# Patient Record
Sex: Female | Born: 1970 | Race: Black or African American | Hispanic: No | Marital: Single | Smoking: Never smoker
Health system: Southern US, Community
[De-identification: ages and names within clinical notes are randomized; demographics above are authoritative.]

## PROBLEM LIST (undated history)

## (undated) DIAGNOSIS — E785 Hyperlipidemia, unspecified: Secondary | ICD-10-CM

## (undated) HISTORY — DX: Hyperlipidemia, unspecified: E78.5

---

## 2014-09-18 ENCOUNTER — Other Ambulatory Visit: Payer: Self-pay

## 2014-09-18 DIAGNOSIS — Z1231 Encounter for screening mammogram for malignant neoplasm of breast: Secondary | ICD-10-CM

## 2014-10-26 ENCOUNTER — Ambulatory Visit
Admission: RE | Admit: 2014-10-26 | Discharge: 2014-10-26 | Disposition: A | Discharge status: Home / Self Care | Payer: Federal, State, Local not specified - PPO | Source: Ambulatory Visit

## 2014-10-26 DIAGNOSIS — Z1231 Encounter for screening mammogram for malignant neoplasm of breast: Secondary | ICD-10-CM

## 2015-06-18 ENCOUNTER — Other Ambulatory Visit: Payer: Self-pay | Admitting: Obstetrics and Gynecology

## 2015-06-18 DIAGNOSIS — N63 Unspecified lump in unspecified breast: Secondary | ICD-10-CM

## 2015-06-24 ENCOUNTER — Other Ambulatory Visit: Payer: Self-pay | Admitting: Obstetrics and Gynecology

## 2015-06-24 ENCOUNTER — Ambulatory Visit
Admission: RE | Admit: 2015-06-24 | Discharge: 2015-06-24 | Disposition: A | Discharge status: Home / Self Care | Payer: Federal, State, Local not specified - PPO | Source: Ambulatory Visit | Attending: Obstetrics and Gynecology | Admitting: Obstetrics and Gynecology

## 2015-06-24 DIAGNOSIS — N63 Unspecified lump in unspecified breast: Secondary | ICD-10-CM

## 2015-10-08 ENCOUNTER — Other Ambulatory Visit: Payer: Self-pay | Admitting: Obstetrics and Gynecology

## 2015-10-08 DIAGNOSIS — Z1231 Encounter for screening mammogram for malignant neoplasm of breast: Secondary | ICD-10-CM

## 2015-10-19 DIAGNOSIS — G43719 Chronic migraine without aura, intractable, without status migrainosus: Secondary | ICD-10-CM | POA: Diagnosis not present

## 2015-10-19 DIAGNOSIS — G43839 Menstrual migraine, intractable, without status migrainosus: Secondary | ICD-10-CM | POA: Diagnosis not present

## 2015-10-29 ENCOUNTER — Ambulatory Visit
Admission: RE | Admit: 2015-10-29 | Discharge: 2015-10-29 | Disposition: A | Discharge status: Home / Self Care | Payer: Federal, State, Local not specified - PPO | Source: Ambulatory Visit | Attending: Obstetrics and Gynecology | Admitting: Obstetrics and Gynecology

## 2015-10-29 DIAGNOSIS — Z1231 Encounter for screening mammogram for malignant neoplasm of breast: Secondary | ICD-10-CM

## 2015-10-29 IMAGING — MG 2D DIGITAL SCREENING BILATERAL MAMMOGRAM WITH CAD AND ADJUNCT TO
9 of 12 series · 9 of 28 positions shown · non-contrast
Comparison: Previous exam(s).

CLINICAL DATA: Screening.

EXAM:
2D DIGITAL SCREENING BILATERAL MAMMOGRAM WITH CAD AND ADJUNCT TOMO

[R CC]
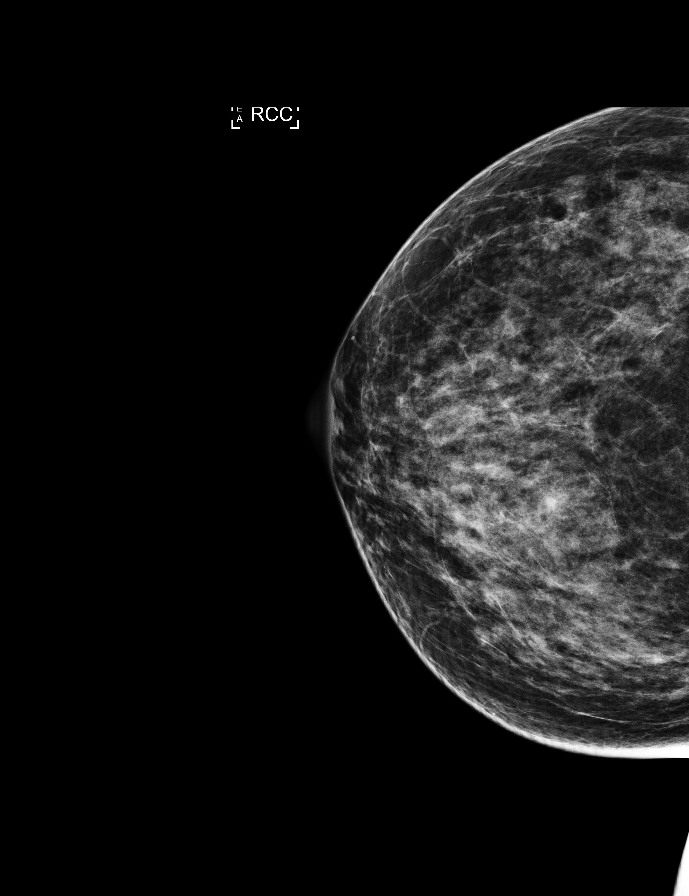

[L CC]
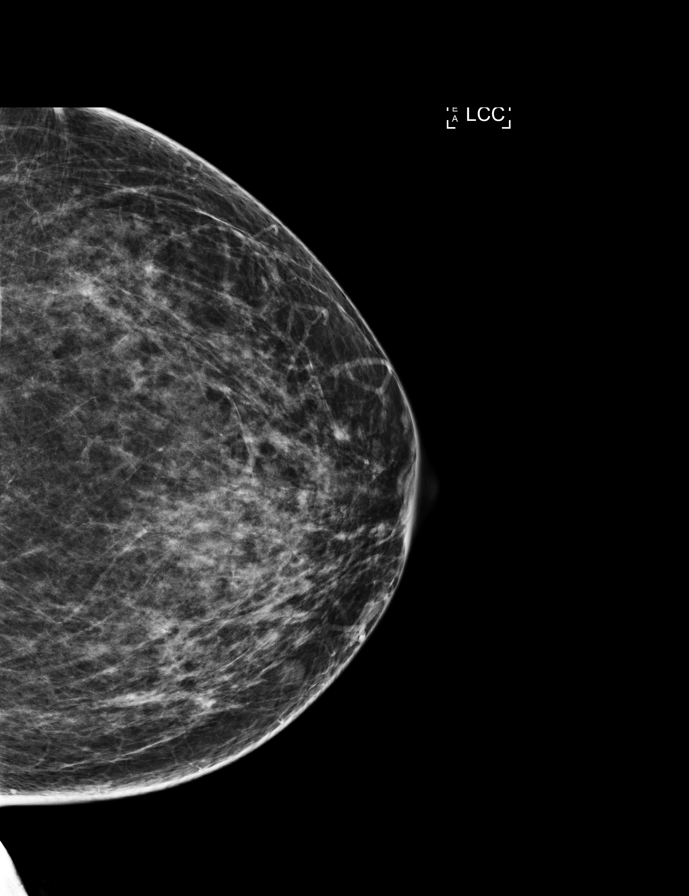

[L CC synth-2D]
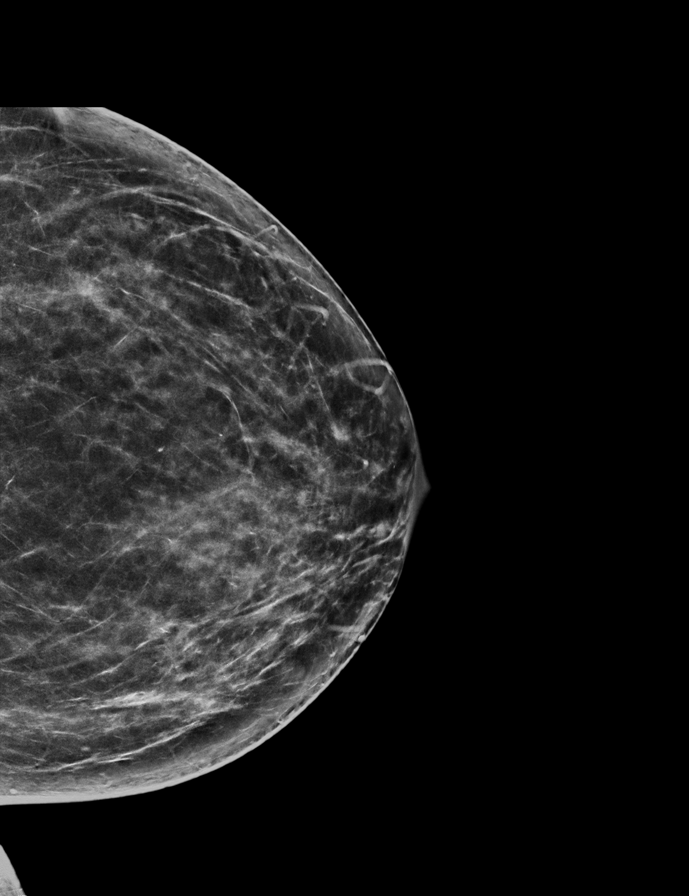

[R MLO synth-2D]
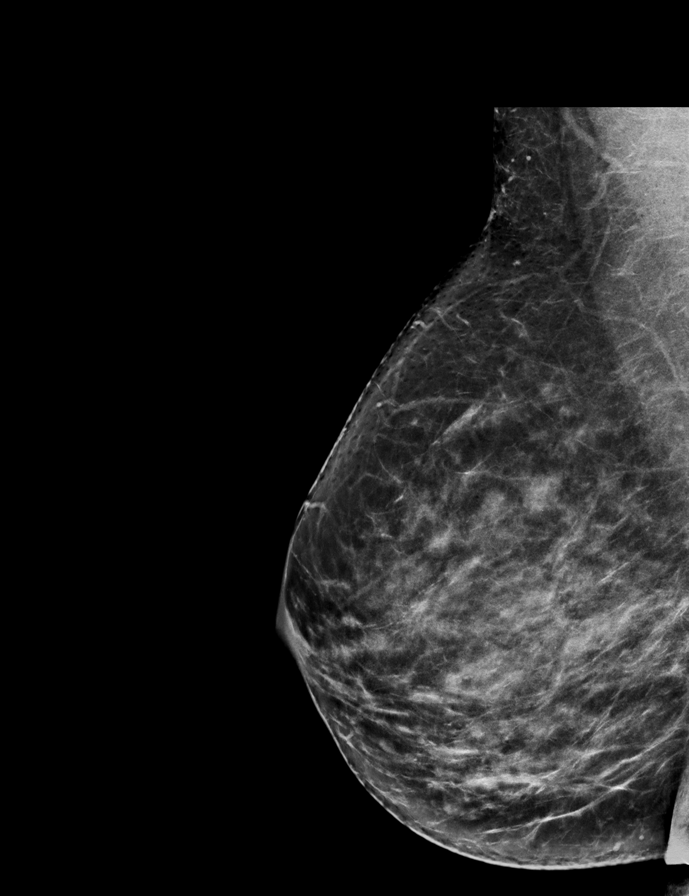

[R CC synth-2D]
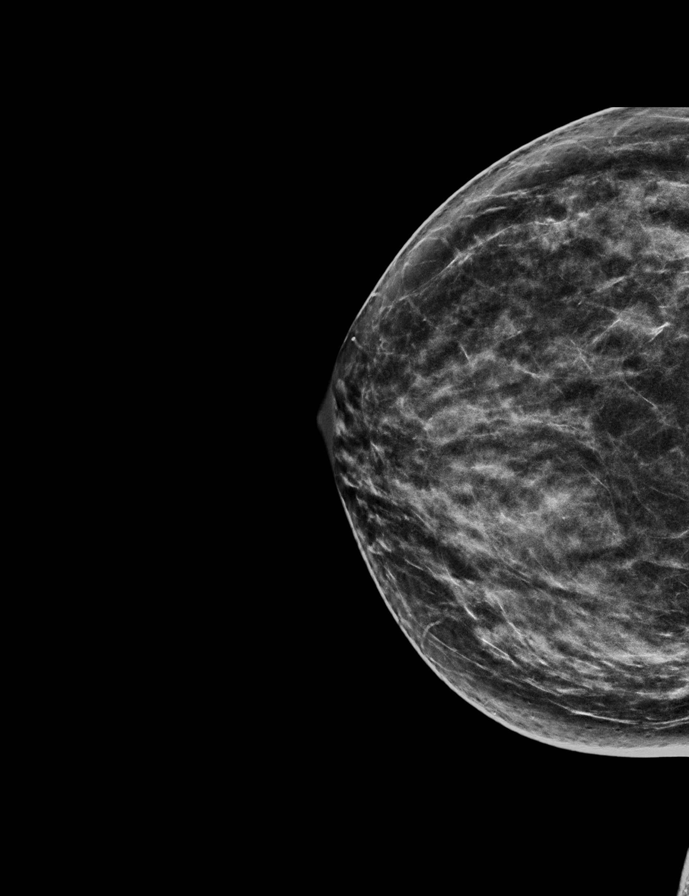

[L MLO synth-2D]
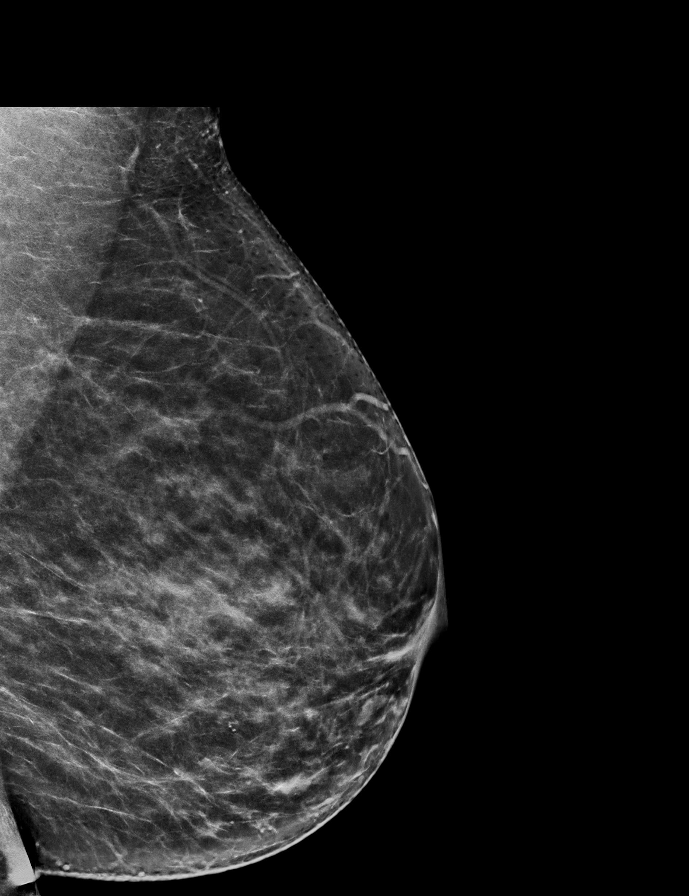

[R MLO]
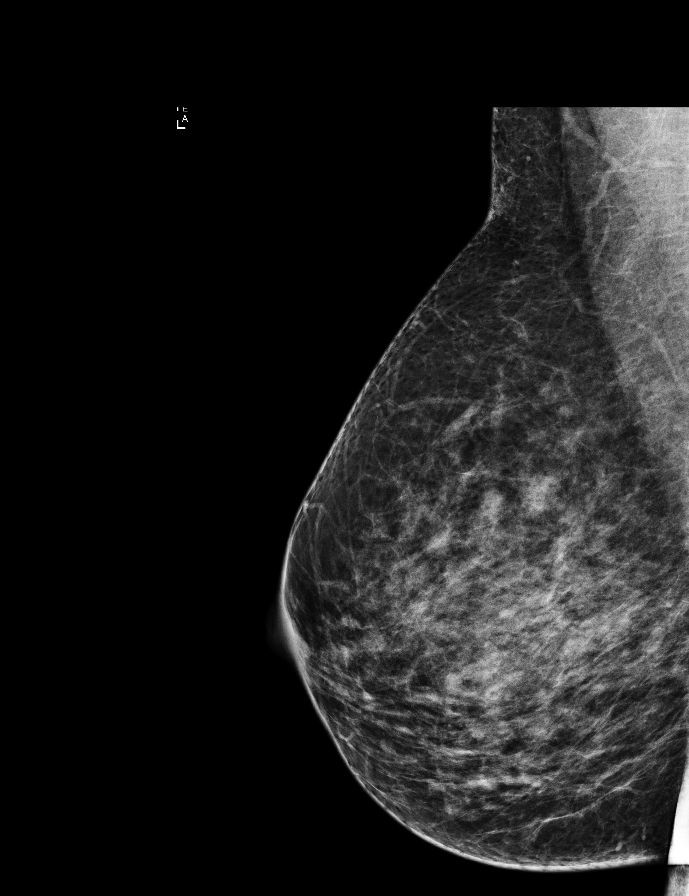

[L MLO]
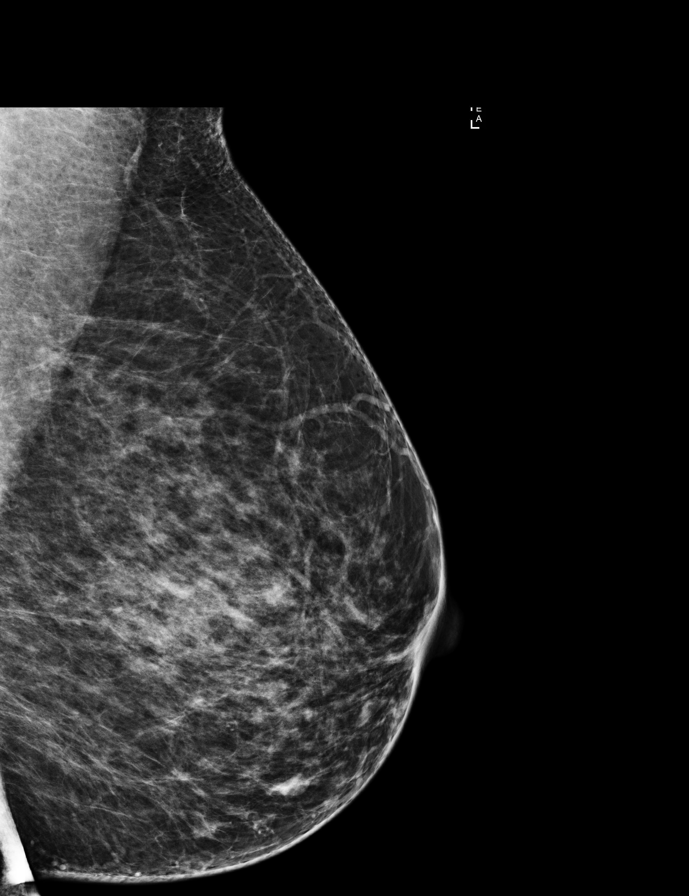

[R CC tomo · tomo slice 33/64.0]
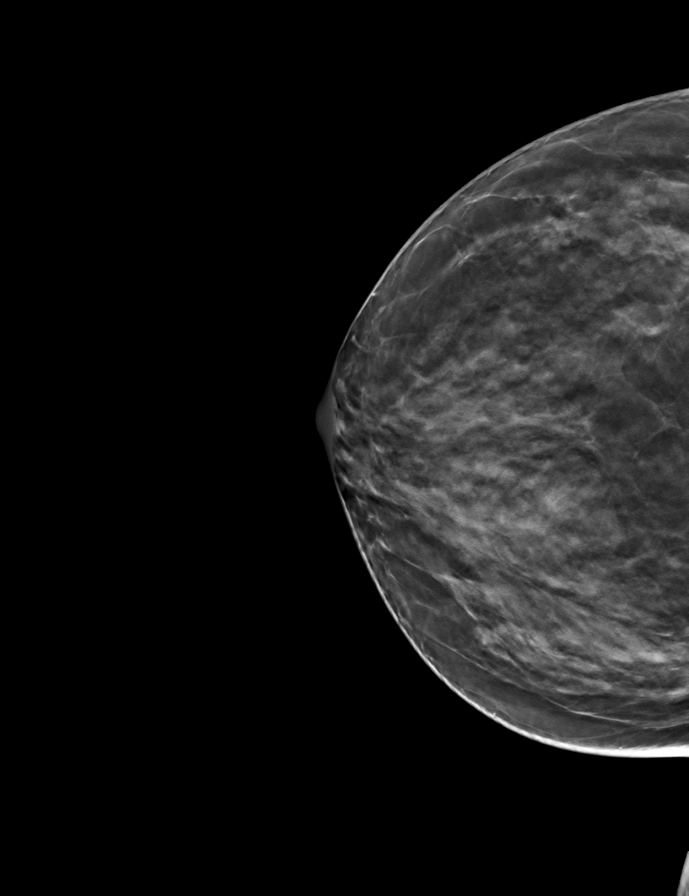

[9 of 28 positions shown; findings below may reference images not displayed]

ACR Breast Density Category c: The breast tissue is heterogeneously
dense, which may obscure small masses.
FINDINGS: In the right breast, an asymmetry warrants further evaluation. In
the left breast, no findings suspicious for malignancy. Images were
processed with CAD.
IMPRESSION: Further evaluation is suggested for an asymmetry in the right
breast.

RECOMMENDATION:
Diagnostic mammogram and possibly ultrasound of the right breast.
(Code:[6W])

The patient will be contacted regarding the findings, and additional
imaging will be scheduled.

BI-RADS CATEGORY  0: Incomplete. Need additional imaging evaluation
and/or prior mammograms for comparison.

## 2015-11-04 ENCOUNTER — Other Ambulatory Visit: Payer: Self-pay | Admitting: Obstetrics and Gynecology

## 2015-11-04 DIAGNOSIS — N6489 Other specified disorders of breast: Secondary | ICD-10-CM | POA: Diagnosis not present

## 2015-11-04 DIAGNOSIS — R928 Other abnormal and inconclusive findings on diagnostic imaging of breast: Secondary | ICD-10-CM

## 2015-11-10 ENCOUNTER — Ambulatory Visit
Admission: RE | Admit: 2015-11-10 | Discharge: 2015-11-10 | Disposition: A | Discharge status: Home / Self Care | Payer: Federal, State, Local not specified - PPO | Source: Ambulatory Visit | Attending: Obstetrics and Gynecology | Admitting: Obstetrics and Gynecology

## 2015-11-10 DIAGNOSIS — R928 Other abnormal and inconclusive findings on diagnostic imaging of breast: Secondary | ICD-10-CM

## 2015-11-10 DIAGNOSIS — R922 Inconclusive mammogram: Secondary | ICD-10-CM | POA: Diagnosis not present

## 2015-12-03 DIAGNOSIS — Z800 Family history of malignant neoplasm of digestive organs: Secondary | ICD-10-CM | POA: Diagnosis not present

## 2015-12-03 DIAGNOSIS — Z0000 Encounter for general adult medical examination without abnormal findings: Secondary | ICD-10-CM | POA: Diagnosis not present

## 2015-12-06 DIAGNOSIS — K080 Exfoliation of teeth due to systemic causes: Secondary | ICD-10-CM | POA: Diagnosis not present

## 2015-12-27 DIAGNOSIS — Z86010 Personal history of colonic polyps: Secondary | ICD-10-CM | POA: Diagnosis not present

## 2016-04-04 DIAGNOSIS — G43719 Chronic migraine without aura, intractable, without status migrainosus: Secondary | ICD-10-CM | POA: Diagnosis not present

## 2016-04-04 DIAGNOSIS — G43839 Menstrual migraine, intractable, without status migrainosus: Secondary | ICD-10-CM | POA: Diagnosis not present

## 2016-07-25 DIAGNOSIS — K080 Exfoliation of teeth due to systemic causes: Secondary | ICD-10-CM | POA: Diagnosis not present

## 2016-09-19 DIAGNOSIS — K080 Exfoliation of teeth due to systemic causes: Secondary | ICD-10-CM | POA: Diagnosis not present

## 2016-11-15 DIAGNOSIS — G43719 Chronic migraine without aura, intractable, without status migrainosus: Secondary | ICD-10-CM | POA: Diagnosis not present

## 2016-11-15 DIAGNOSIS — G43839 Menstrual migraine, intractable, without status migrainosus: Secondary | ICD-10-CM | POA: Diagnosis not present

## 2016-12-05 DIAGNOSIS — Z131 Encounter for screening for diabetes mellitus: Secondary | ICD-10-CM | POA: Diagnosis not present

## 2016-12-05 DIAGNOSIS — Z0000 Encounter for general adult medical examination without abnormal findings: Secondary | ICD-10-CM | POA: Diagnosis not present

## 2016-12-05 DIAGNOSIS — E7800 Pure hypercholesterolemia, unspecified: Secondary | ICD-10-CM | POA: Diagnosis not present

## 2016-12-26 ENCOUNTER — Other Ambulatory Visit: Payer: Self-pay | Admitting: Physician Assistant

## 2016-12-26 DIAGNOSIS — Z1231 Encounter for screening mammogram for malignant neoplasm of breast: Secondary | ICD-10-CM

## 2017-01-08 ENCOUNTER — Ambulatory Visit: Payer: Federal, State, Local not specified - PPO

## 2017-01-15 ENCOUNTER — Ambulatory Visit
Admission: RE | Admit: 2017-01-15 | Discharge: 2017-01-15 | Disposition: A | Discharge status: Home / Self Care | Payer: Federal, State, Local not specified - PPO | Source: Ambulatory Visit | Attending: Physician Assistant | Admitting: Physician Assistant

## 2017-01-15 DIAGNOSIS — Z1231 Encounter for screening mammogram for malignant neoplasm of breast: Secondary | ICD-10-CM | POA: Diagnosis not present

## 2017-05-07 DIAGNOSIS — G43719 Chronic migraine without aura, intractable, without status migrainosus: Secondary | ICD-10-CM | POA: Diagnosis not present

## 2017-05-07 DIAGNOSIS — G43839 Menstrual migraine, intractable, without status migrainosus: Secondary | ICD-10-CM | POA: Diagnosis not present

## 2017-08-09 DIAGNOSIS — K080 Exfoliation of teeth due to systemic causes: Secondary | ICD-10-CM | POA: Diagnosis not present

## 2017-08-10 DIAGNOSIS — Z01419 Encounter for gynecological examination (general) (routine) without abnormal findings: Secondary | ICD-10-CM | POA: Diagnosis not present

## 2017-08-10 DIAGNOSIS — Z1151 Encounter for screening for human papillomavirus (HPV): Secondary | ICD-10-CM | POA: Diagnosis not present

## 2017-08-10 DIAGNOSIS — K5900 Constipation, unspecified: Secondary | ICD-10-CM | POA: Diagnosis not present

## 2017-10-10 DIAGNOSIS — K625 Hemorrhage of anus and rectum: Secondary | ICD-10-CM | POA: Diagnosis not present

## 2017-10-10 DIAGNOSIS — K5909 Other constipation: Secondary | ICD-10-CM | POA: Diagnosis not present

## 2017-10-10 DIAGNOSIS — R634 Abnormal weight loss: Secondary | ICD-10-CM | POA: Diagnosis not present

## 2017-10-31 DIAGNOSIS — K648 Other hemorrhoids: Secondary | ICD-10-CM | POA: Diagnosis not present

## 2017-10-31 DIAGNOSIS — K625 Hemorrhage of anus and rectum: Secondary | ICD-10-CM | POA: Diagnosis not present

## 2017-10-31 DIAGNOSIS — Z800 Family history of malignant neoplasm of digestive organs: Secondary | ICD-10-CM | POA: Diagnosis not present

## 2017-11-07 DIAGNOSIS — G43839 Menstrual migraine, intractable, without status migrainosus: Secondary | ICD-10-CM | POA: Diagnosis not present

## 2017-11-07 DIAGNOSIS — M791 Myalgia, unspecified site: Secondary | ICD-10-CM | POA: Diagnosis not present

## 2017-11-07 DIAGNOSIS — G43719 Chronic migraine without aura, intractable, without status migrainosus: Secondary | ICD-10-CM | POA: Diagnosis not present

## 2017-11-07 DIAGNOSIS — M542 Cervicalgia: Secondary | ICD-10-CM | POA: Diagnosis not present

## 2017-12-27 DIAGNOSIS — E7800 Pure hypercholesterolemia, unspecified: Secondary | ICD-10-CM | POA: Diagnosis not present

## 2017-12-27 DIAGNOSIS — Z13220 Encounter for screening for lipoid disorders: Secondary | ICD-10-CM | POA: Diagnosis not present

## 2017-12-27 DIAGNOSIS — Z0000 Encounter for general adult medical examination without abnormal findings: Secondary | ICD-10-CM | POA: Diagnosis not present

## 2017-12-27 DIAGNOSIS — Z23 Encounter for immunization: Secondary | ICD-10-CM | POA: Diagnosis not present

## 2018-01-09 ENCOUNTER — Other Ambulatory Visit: Payer: Self-pay | Admitting: Nurse Practitioner

## 2018-01-09 DIAGNOSIS — Z1231 Encounter for screening mammogram for malignant neoplasm of breast: Secondary | ICD-10-CM

## 2018-02-05 ENCOUNTER — Ambulatory Visit: Payer: Federal, State, Local not specified - PPO

## 2018-02-05 ENCOUNTER — Ambulatory Visit
Admission: RE | Admit: 2018-02-05 | Discharge: 2018-02-05 | Disposition: A | Discharge status: Home / Self Care | Payer: Federal, State, Local not specified - PPO | Source: Ambulatory Visit | Attending: Nurse Practitioner | Admitting: Nurse Practitioner

## 2018-02-05 DIAGNOSIS — Z1231 Encounter for screening mammogram for malignant neoplasm of breast: Secondary | ICD-10-CM | POA: Diagnosis not present

## 2018-02-05 IMAGING — MG DIGITAL SCREENING BILATERAL MAMMOGRAM WITH TOMO AND CAD
8 series · 9 of 24 positions shown · non-contrast
Comparison: Previous exam(s).

CLINICAL DATA: Screening.

EXAM:
DIGITAL SCREENING BILATERAL MAMMOGRAM WITH TOMO AND CAD

[R MLO synth-2D]
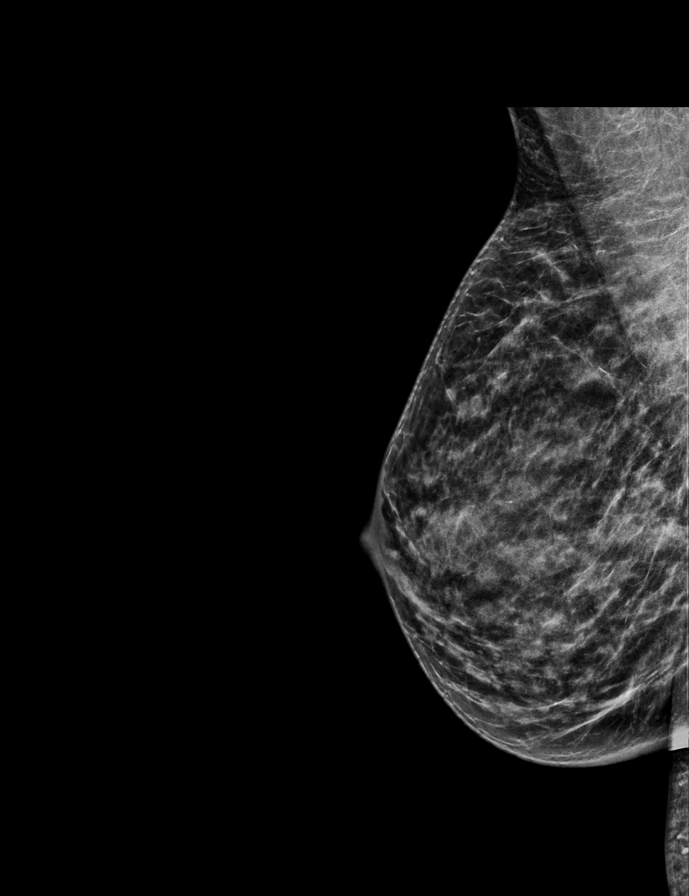

[L MLO synth-2D]
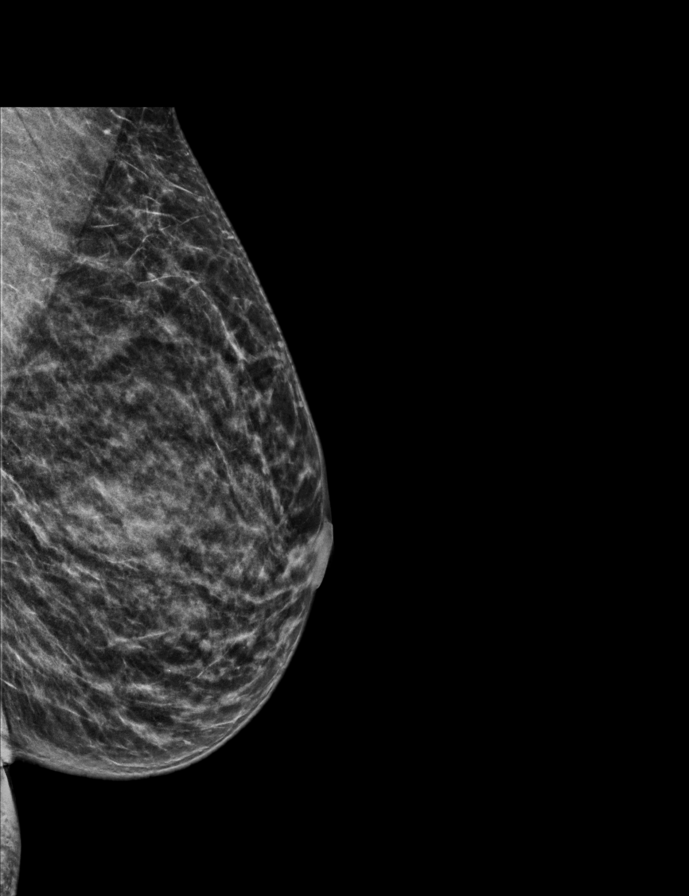

[L CC synth-2D]
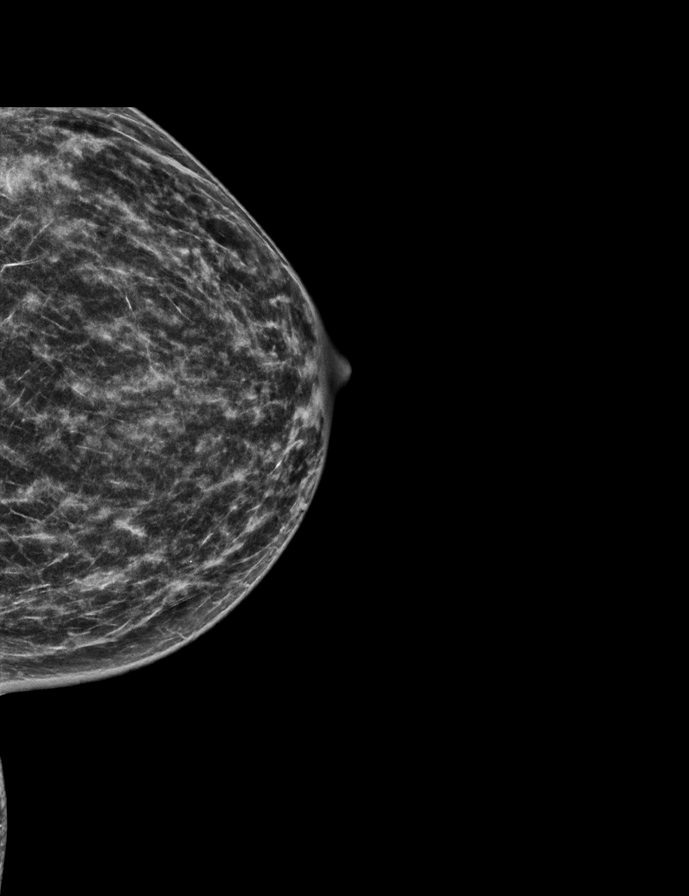

[R CC synth-2D]
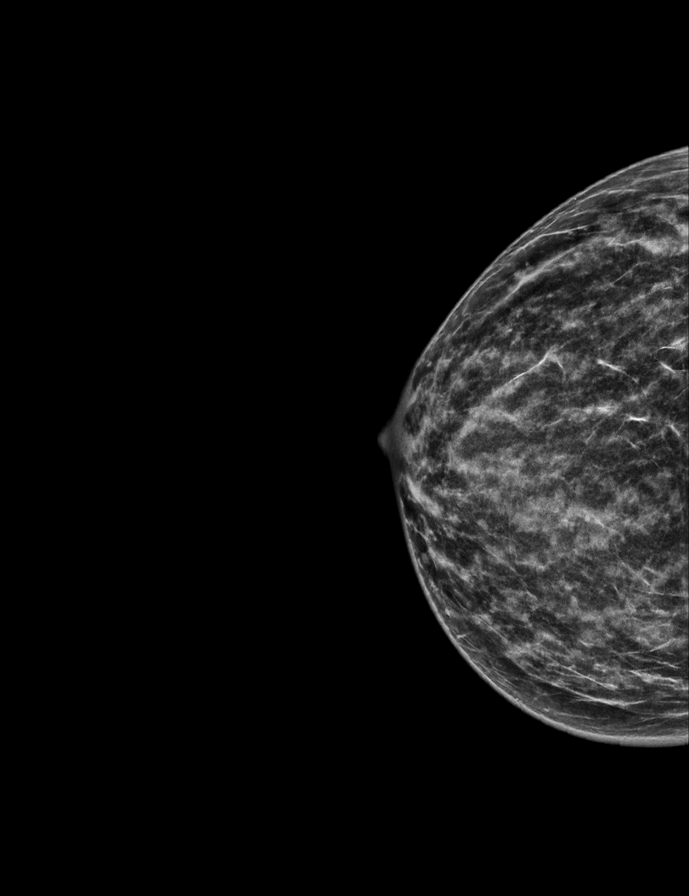

[L MLO tomo · 2 of 51 frames shown]
[frame 17/51]
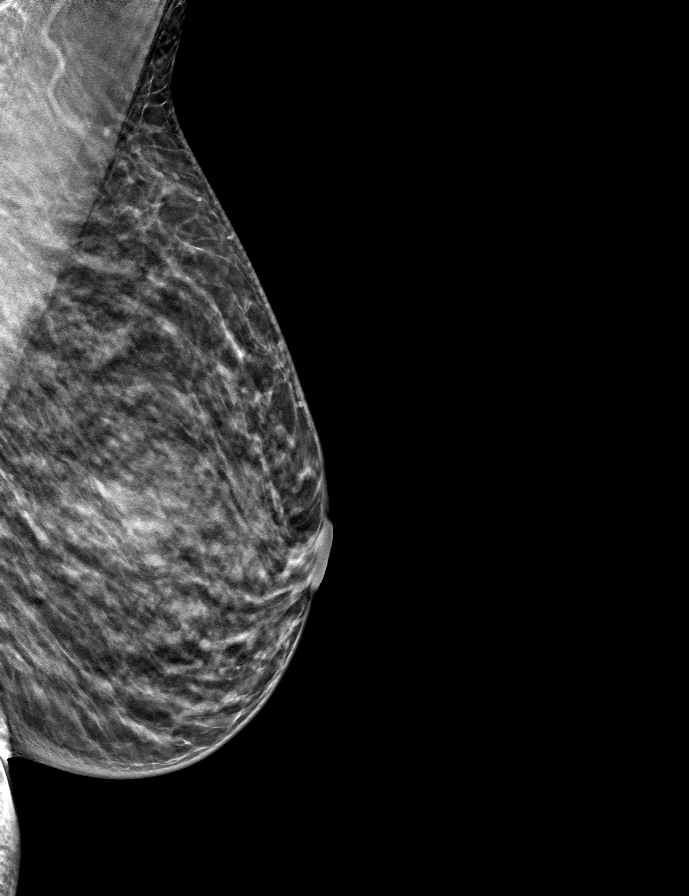
[frame 26/51]
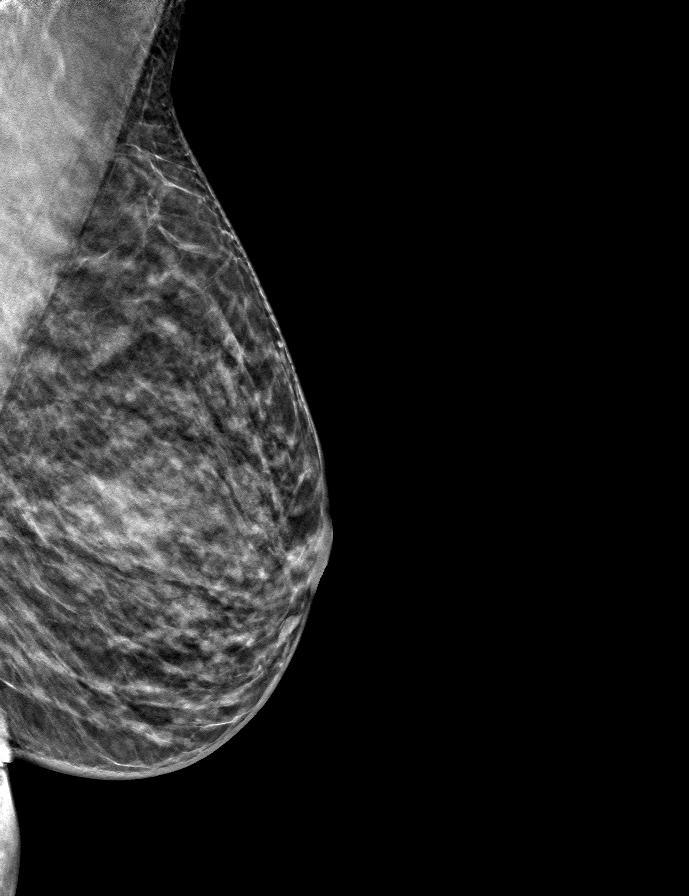

[R MLO tomo · tomo slice 27/54.0]
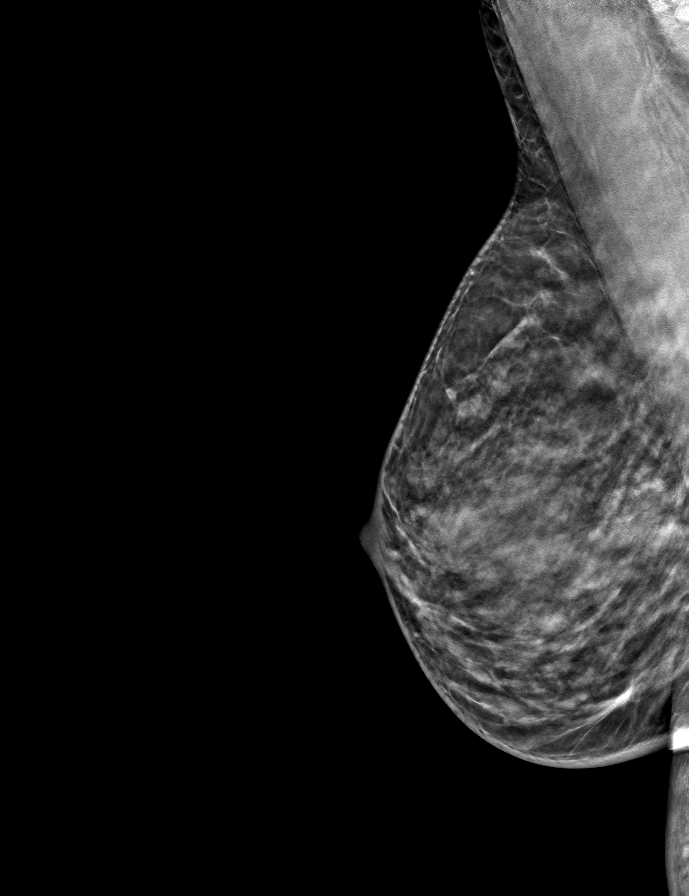

[L CC tomo · tomo slice 25/50.0]
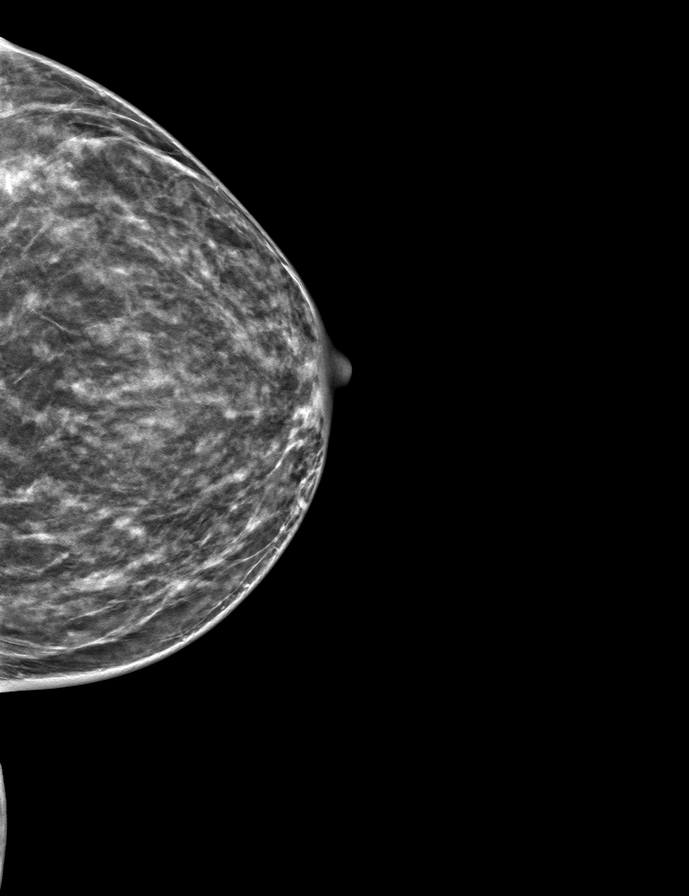

[R CC tomo · tomo slice 25/48.0]
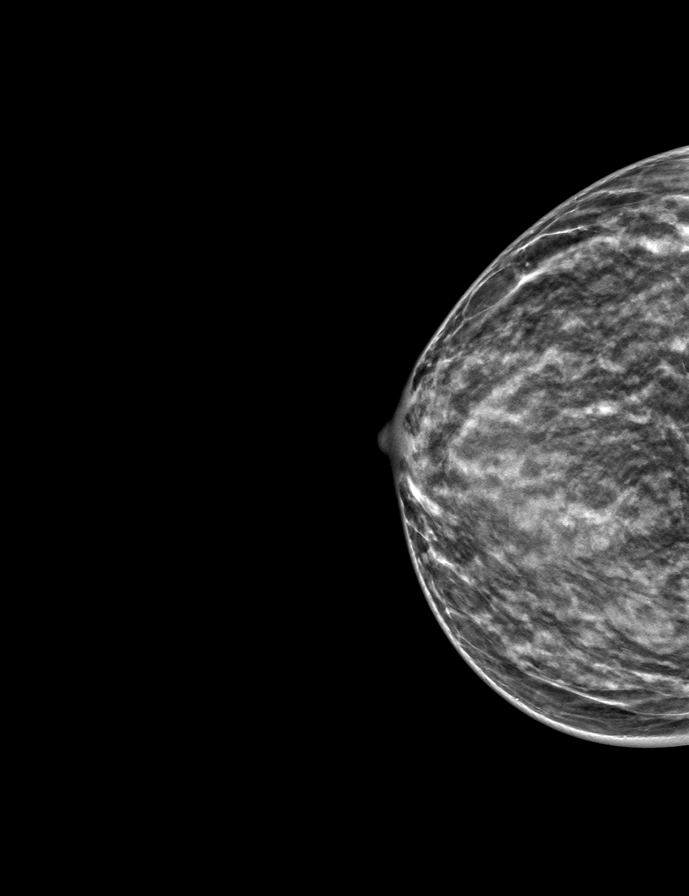

[9 of 24 positions shown; findings below may reference images not displayed]

ACR Breast Density Category c: The breast tissue is heterogeneously
dense, which may obscure small masses.
FINDINGS: There are no findings suspicious for malignancy. Images were
processed with CAD.
IMPRESSION: No mammographic evidence of malignancy. A result letter of this
screening mammogram will be mailed directly to the patient.

RECOMMENDATION:
Screening mammogram in one year. (Code:[5V])

BI-RADS CATEGORY  1: Negative.

## 2018-02-14 DIAGNOSIS — K080 Exfoliation of teeth due to systemic causes: Secondary | ICD-10-CM | POA: Diagnosis not present

## 2018-05-20 DIAGNOSIS — K5904 Chronic idiopathic constipation: Secondary | ICD-10-CM | POA: Diagnosis not present

## 2018-05-20 DIAGNOSIS — K625 Hemorrhage of anus and rectum: Secondary | ICD-10-CM | POA: Diagnosis not present

## 2018-05-20 DIAGNOSIS — K648 Other hemorrhoids: Secondary | ICD-10-CM | POA: Diagnosis not present

## 2018-05-20 DIAGNOSIS — R197 Diarrhea, unspecified: Secondary | ICD-10-CM | POA: Diagnosis not present

## 2018-06-19 DIAGNOSIS — K5904 Chronic idiopathic constipation: Secondary | ICD-10-CM | POA: Diagnosis not present

## 2018-06-19 DIAGNOSIS — K648 Other hemorrhoids: Secondary | ICD-10-CM | POA: Diagnosis not present

## 2018-06-19 DIAGNOSIS — K625 Hemorrhage of anus and rectum: Secondary | ICD-10-CM | POA: Diagnosis not present

## 2018-07-02 DIAGNOSIS — G43719 Chronic migraine without aura, intractable, without status migrainosus: Secondary | ICD-10-CM | POA: Diagnosis not present

## 2018-07-02 DIAGNOSIS — G43839 Menstrual migraine, intractable, without status migrainosus: Secondary | ICD-10-CM | POA: Diagnosis not present

## 2018-09-30 DIAGNOSIS — K5904 Chronic idiopathic constipation: Secondary | ICD-10-CM | POA: Diagnosis not present

## 2018-09-30 DIAGNOSIS — K648 Other hemorrhoids: Secondary | ICD-10-CM | POA: Diagnosis not present

## 2018-09-30 DIAGNOSIS — K625 Hemorrhage of anus and rectum: Secondary | ICD-10-CM | POA: Diagnosis not present

## 2019-05-06 DIAGNOSIS — Z0000 Encounter for general adult medical examination without abnormal findings: Secondary | ICD-10-CM | POA: Diagnosis not present

## 2019-05-30 DIAGNOSIS — E7800 Pure hypercholesterolemia, unspecified: Secondary | ICD-10-CM | POA: Diagnosis not present

## 2019-05-30 DIAGNOSIS — Z131 Encounter for screening for diabetes mellitus: Secondary | ICD-10-CM | POA: Diagnosis not present

## 2020-01-27 DIAGNOSIS — F419 Anxiety disorder, unspecified: Secondary | ICD-10-CM | POA: Diagnosis not present

## 2020-01-27 DIAGNOSIS — Z23 Encounter for immunization: Secondary | ICD-10-CM | POA: Diagnosis not present

## 2020-05-31 DIAGNOSIS — E7800 Pure hypercholesterolemia, unspecified: Secondary | ICD-10-CM | POA: Diagnosis not present

## 2020-05-31 DIAGNOSIS — Z0000 Encounter for general adult medical examination without abnormal findings: Secondary | ICD-10-CM | POA: Diagnosis not present

## 2020-05-31 DIAGNOSIS — Z23 Encounter for immunization: Secondary | ICD-10-CM | POA: Diagnosis not present

## 2020-05-31 DIAGNOSIS — Z5181 Encounter for therapeutic drug level monitoring: Secondary | ICD-10-CM | POA: Diagnosis not present

## 2020-06-01 ENCOUNTER — Other Ambulatory Visit: Payer: Self-pay | Admitting: Physician Assistant

## 2020-06-01 DIAGNOSIS — Z1231 Encounter for screening mammogram for malignant neoplasm of breast: Secondary | ICD-10-CM

## 2020-12-08 ENCOUNTER — Other Ambulatory Visit: Payer: Self-pay | Admitting: Physician Assistant

## 2020-12-08 DIAGNOSIS — R413 Other amnesia: Secondary | ICD-10-CM

## 2020-12-08 DIAGNOSIS — R519 Headache, unspecified: Secondary | ICD-10-CM | POA: Diagnosis not present

## 2020-12-19 ENCOUNTER — Ambulatory Visit
Admission: RE | Admit: 2020-12-19 | Discharge: 2020-12-19 | Disposition: A | Discharge status: Home / Self Care | Payer: Federal, State, Local not specified - PPO | Source: Ambulatory Visit | Attending: Physician Assistant | Admitting: Physician Assistant

## 2020-12-19 ENCOUNTER — Other Ambulatory Visit: Payer: Self-pay

## 2020-12-19 DIAGNOSIS — R413 Other amnesia: Secondary | ICD-10-CM

## 2020-12-19 DIAGNOSIS — I6782 Cerebral ischemia: Secondary | ICD-10-CM | POA: Diagnosis not present

## 2020-12-19 IMAGING — MR MR HEAD WO/W CM
13 series · 48 of 48 positions shown · IV contrast (multihance)
Comparison: None.

CLINICAL DATA: Memory loss

EXAM:
MRI HEAD WITHOUT AND WITH CONTRAST
TECHNIQUE: Multiplanar, multiecho pulse sequences of the brain and surrounding
structures were obtained without and with intravenous contrast.
CONTRAST:  13mL MULTIHANCE GADOBENATE DIMEGLUMINE 529 MG/ML IV SOLN

[Series 3: T1 · sagittal · 5.0mm · 0.45mm/px · 1 of 21 slices shown]
[im 1/21]
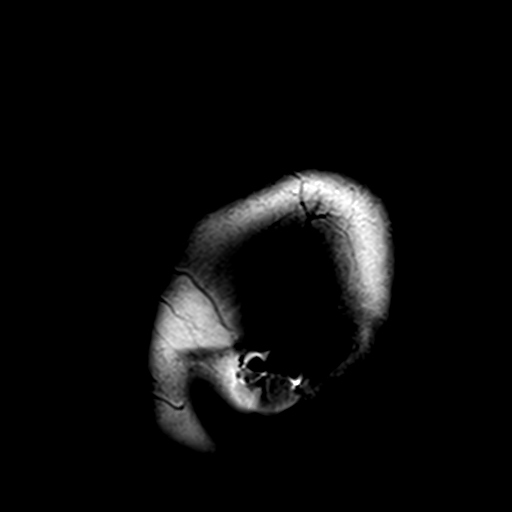

[Series 4: DWI · axial · 3.0mm · 1.80mm/px · z∈[-91,+55]mm · 6 of 100 slices shown (1 of 4)]
[im 1/100]
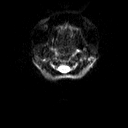
[im 20/100]
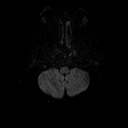
[im 40/100]
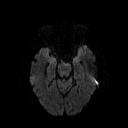
[im 60/100]
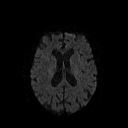
[im 80/100]
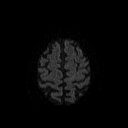
[im 100/100]
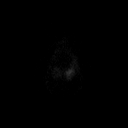

[Series 5: DWI · axial · 3.0mm · 1.80mm/px · z∈[-91,+55]mm · 3 of 50 slices shown (2 of 4)]
[im 1/50]
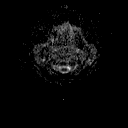
[im 25/50]
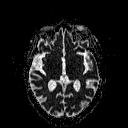
[im 50/50]
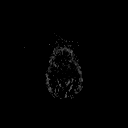

[Series 6: DWI · coronal · 5.0mm · 1.80mm/px · 5 of 68 slices shown (3 of 4)]
[im 1/68]
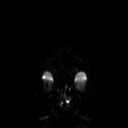
[im 17/68]
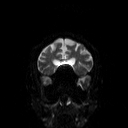
[im 34/68]
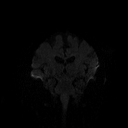
[im 51/68]
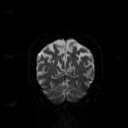
[im 68/68]
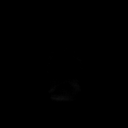

[Series 7: DWI · coronal · 5.0mm · 1.80mm/px · 2 of 34 slices shown (4 of 4)]
[im 1/34]
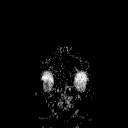
[im 34/34]
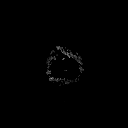

[Series 8: T2 · axial · 5.0mm · 0.60mm/px · 1 of 22 slices shown (1 of 2)]
[im 1/22]
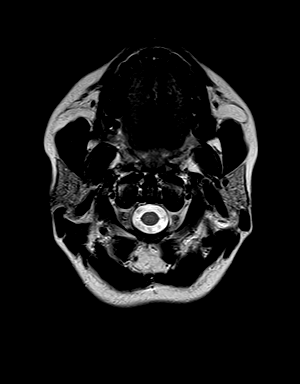

[Series 9: FLAIR · axial · 3.0mm · 0.45mm/px · z∈[-85,+49]mm · 2 of 30 slices shown (1 of 2)]
[im 1/30]
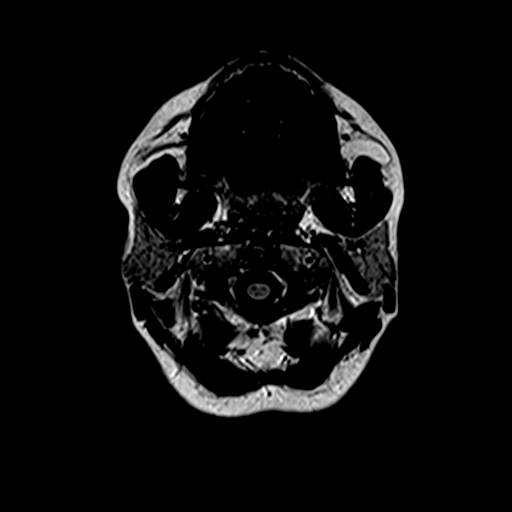
[im 30/30]
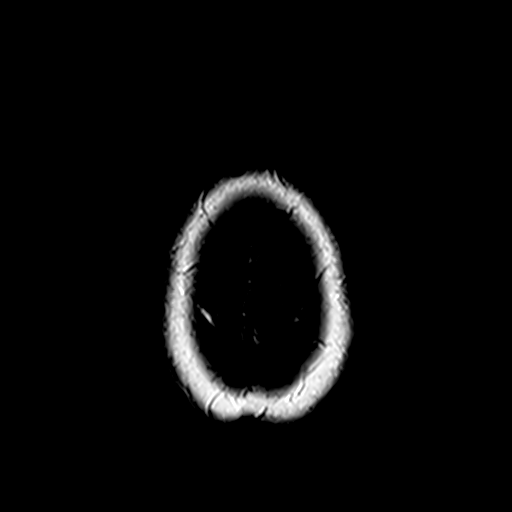

[Series 11: swi_images · axial · 4.0mm · 0.90mm/px · z∈[-87,+51]mm · 2 of 36 slices shown]
[im 1/36]
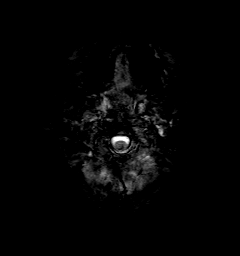
[im 36/36]
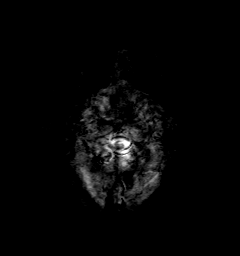

[Series 12: t1_mpr_tra · axial · 1.0mm · 0.75mm/px · z∈[-90,+51]mm · 10 of 144 slices shown]
[im 1/144]
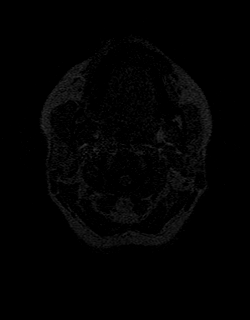
[im 16/144]
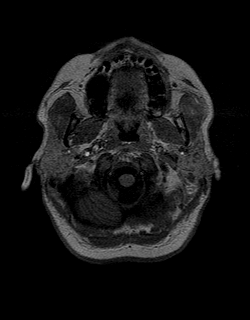
[im 32/144]
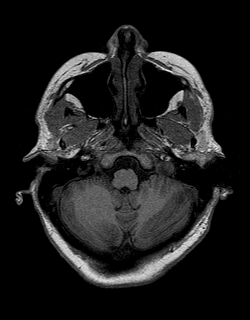
[im 48/144]
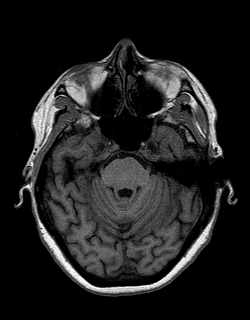
[im 64/144]
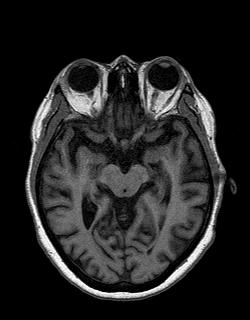
[im 80/144]
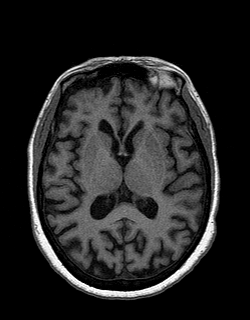
[im 96/144]
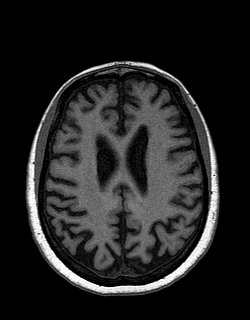
[im 112/144]
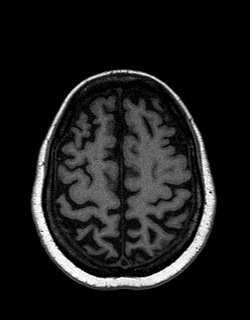
[im 128/144]
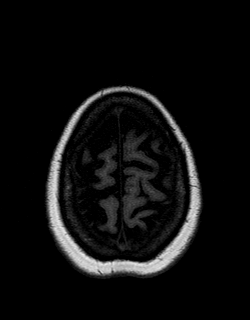
[im 144/144]
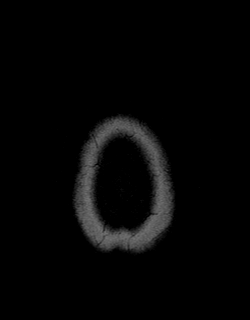

[Series 13: FLAIR · sagittal · 5.0mm · 0.45mm/px · 2 of 25 slices shown (2 of 2)]
[im 1/25]
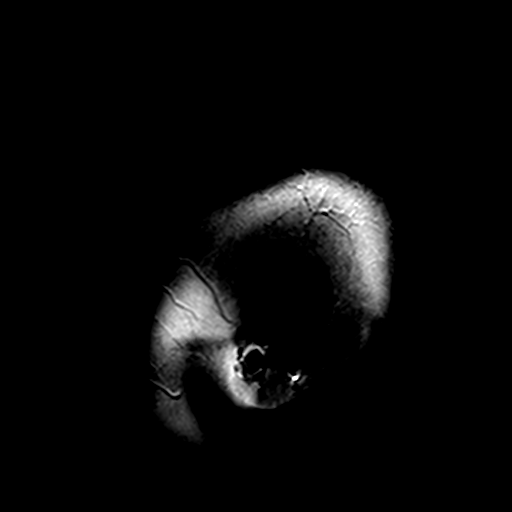
[im 25/25]
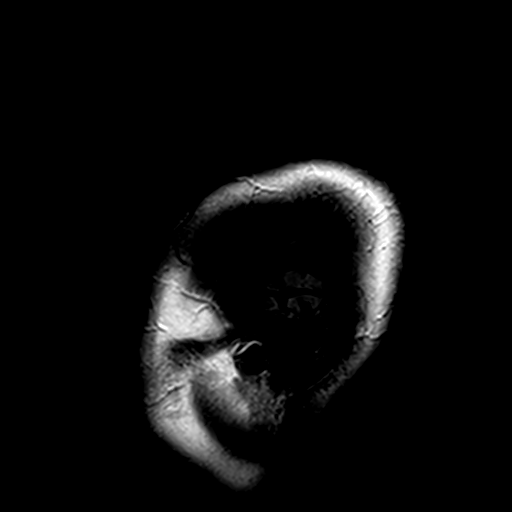

[Series 14: T2 · coronal · 5.0mm · 0.45mm/px · 2 of 25 slices shown (2 of 2)]
[im 1/25]
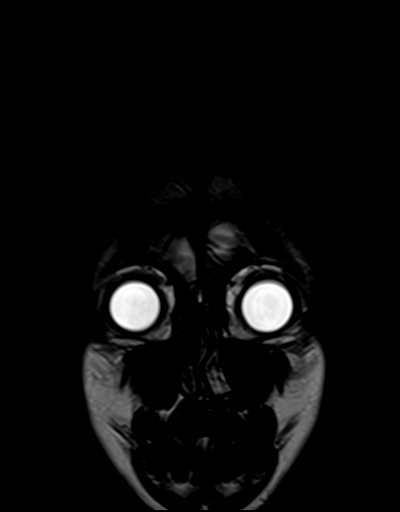
[im 25/25]
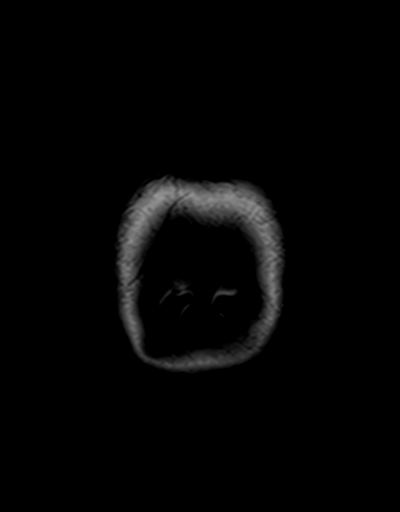

[Series 15: t1_mpr_tra post · axial · 1.0mm · 0.75mm/px · z∈[-90,+51]mm · 10 of 144 slices shown]
[im 1/144]
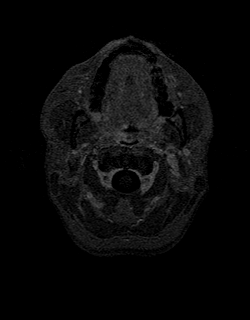
[im 16/144]
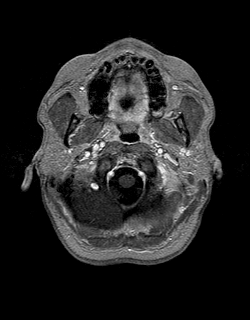
[im 32/144]
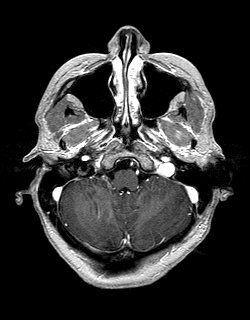
[im 48/144]
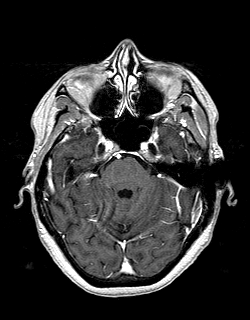
[im 64/144]
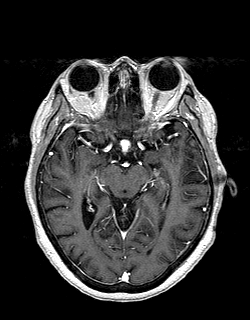
[im 80/144]
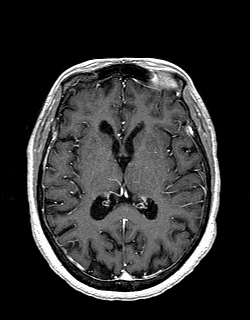
[im 96/144]
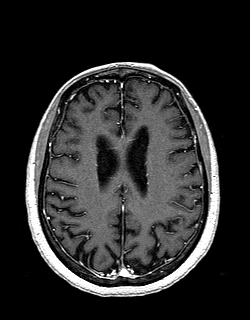
[im 112/144]
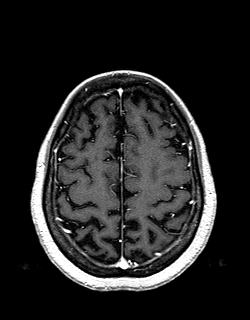
[im 128/144]
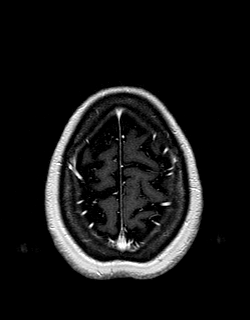
[im 144/144]
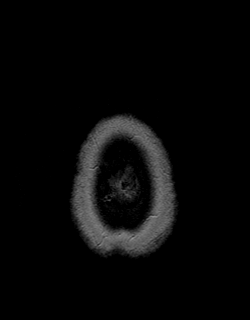

[Series 16: post cor · coronal · 5.0mm · 0.45mm/px · 2 of 25 slices shown]
[im 1/25]
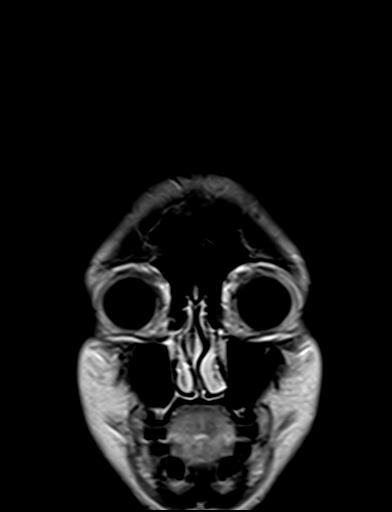
[im 25/25]
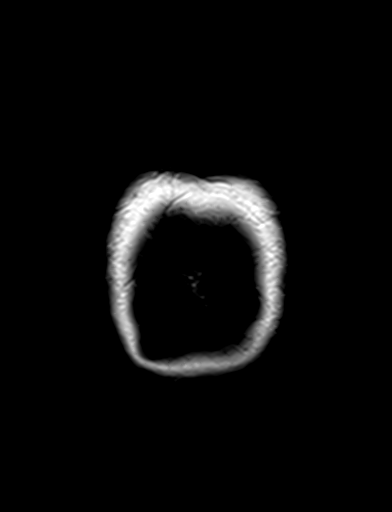

[48 of 48 positions shown; findings below may reference images not displayed]

FINDINGS: Brain: There is no acute infarction or intracranial hemorrhage.
There is no intracranial mass, mass effect, or edema. There is no
hydrocephalus or extra-axial fluid collection. Ventricles and sulci
are mildly prominent reflecting parenchymal volume loss. Patchy T2
hyperintensity in the supratentorial white matter is nonspecific but
may reflect minor chronic microvascular ischemic changes. No
abnormal enhancement.

Vascular: Major vessel flow voids at the skull base are preserved.

Skull and upper cervical spine: Normal marrow signal is preserved.

Sinuses/Orbits: Paranasal sinuses are aerated. Orbits are
unremarkable.

Other: There is superior convex margin of the pituitary with
suprasellar extension. Mastoid air cells are clear.
IMPRESSION: Parenchymal volume loss greater than expected for age.

Minor chronic microvascular ischemic changes.

Prominent pituitary for which dedicated MRI of the sella is
recommended.

## 2020-12-19 MED ORDER — GADOBENATE DIMEGLUMINE 529 MG/ML IV SOLN
13.0000 mL | Freq: Once | INTRAVENOUS | Status: AC | PRN
Start: 2020-12-19 — End: 2020-12-19
  Administered 2020-12-19: 13 mL via INTRAVENOUS

## 2020-12-21 ENCOUNTER — Encounter: Payer: Self-pay | Admitting: Physician Assistant

## 2020-12-21 ENCOUNTER — Ambulatory Visit: Payer: Federal, State, Local not specified - PPO

## 2020-12-25 ENCOUNTER — Other Ambulatory Visit: Payer: Self-pay

## 2020-12-25 ENCOUNTER — Ambulatory Visit
Admission: RE | Admit: 2020-12-25 | Discharge: 2020-12-25 | Disposition: A | Discharge status: Home / Self Care | Payer: Federal, State, Local not specified - PPO | Source: Ambulatory Visit | Attending: Physician Assistant | Admitting: Physician Assistant

## 2020-12-25 DIAGNOSIS — Z1231 Encounter for screening mammogram for malignant neoplasm of breast: Secondary | ICD-10-CM | POA: Diagnosis not present

## 2020-12-25 IMAGING — MG MM DIGITAL SCREENING BILAT W/ TOMO AND CAD
8 series · 8 of 24 positions shown · non-contrast
Comparison: Previous exam(s).

CLINICAL DATA: Screening.

EXAM:
DIGITAL SCREENING BILATERAL MAMMOGRAM WITH TOMOSYNTHESIS AND CAD
TECHNIQUE: Bilateral screening digital craniocaudal and mediolateral oblique
mammograms were obtained. Bilateral screening digital breast
tomosynthesis was performed. The images were evaluated with
computer-aided detection.

[R CC synth-2D]
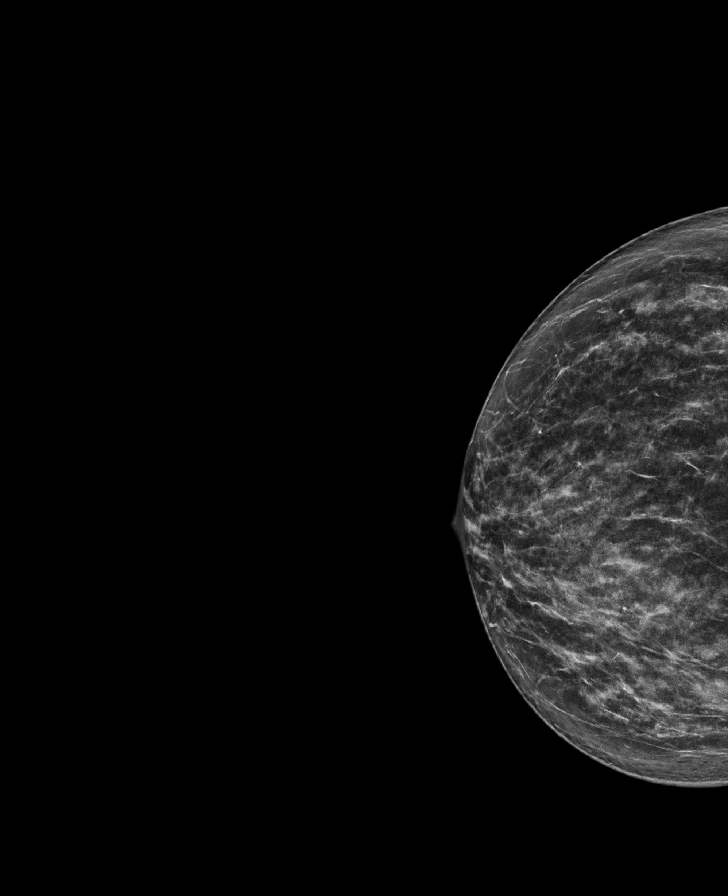

[L MLO synth-2D]
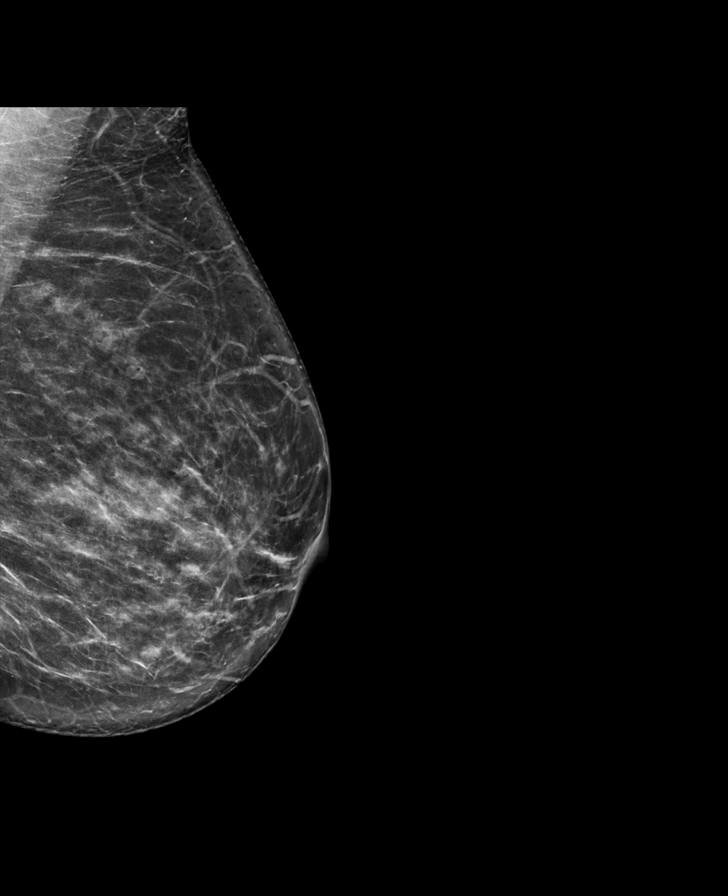

[L CC synth-2D]
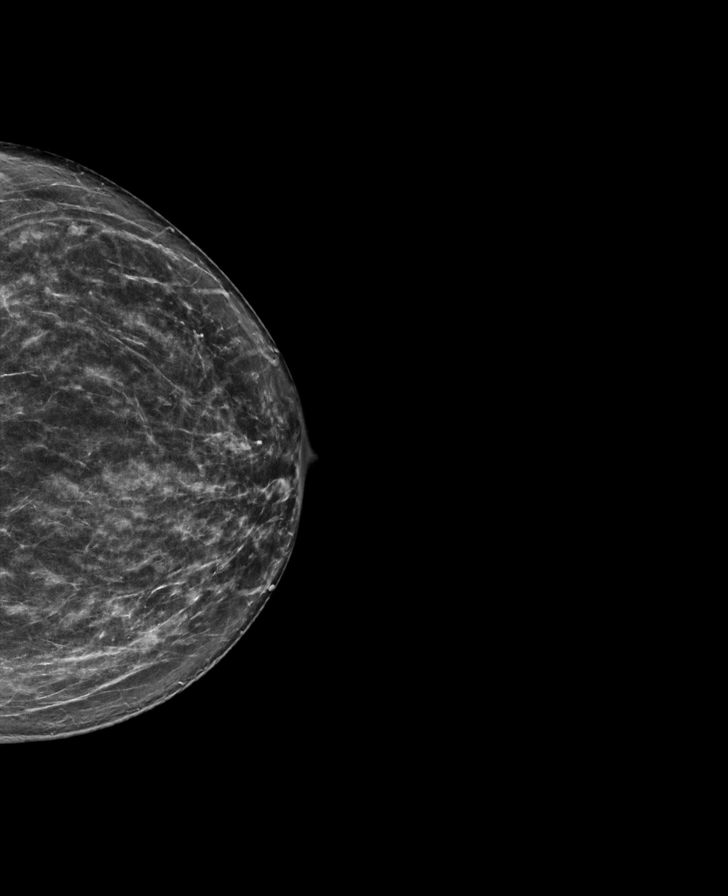

[R MLO synth-2D]
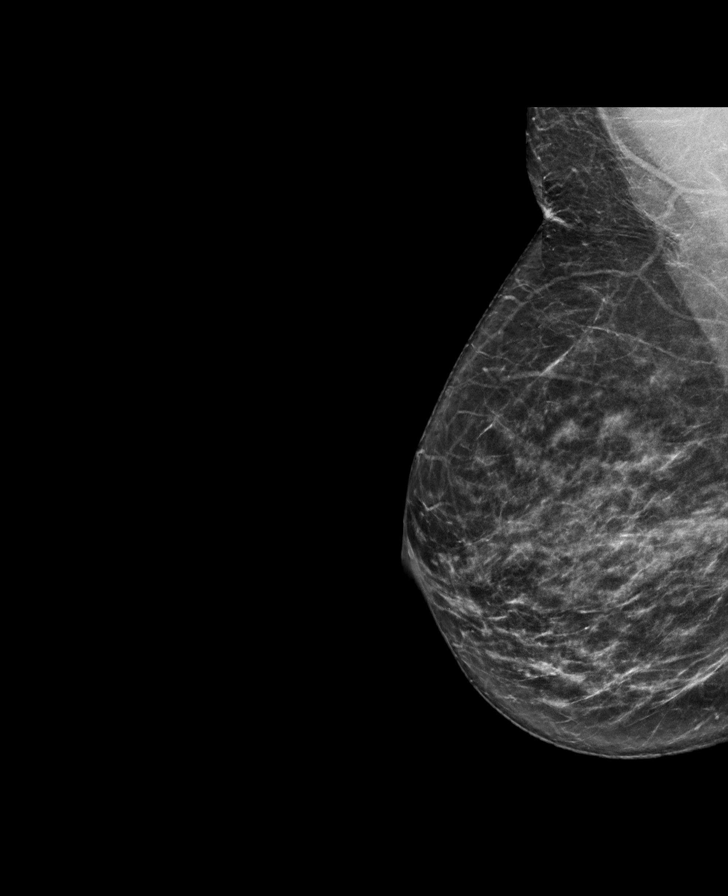

[R CC tomo · tomo slice 35/68.0]
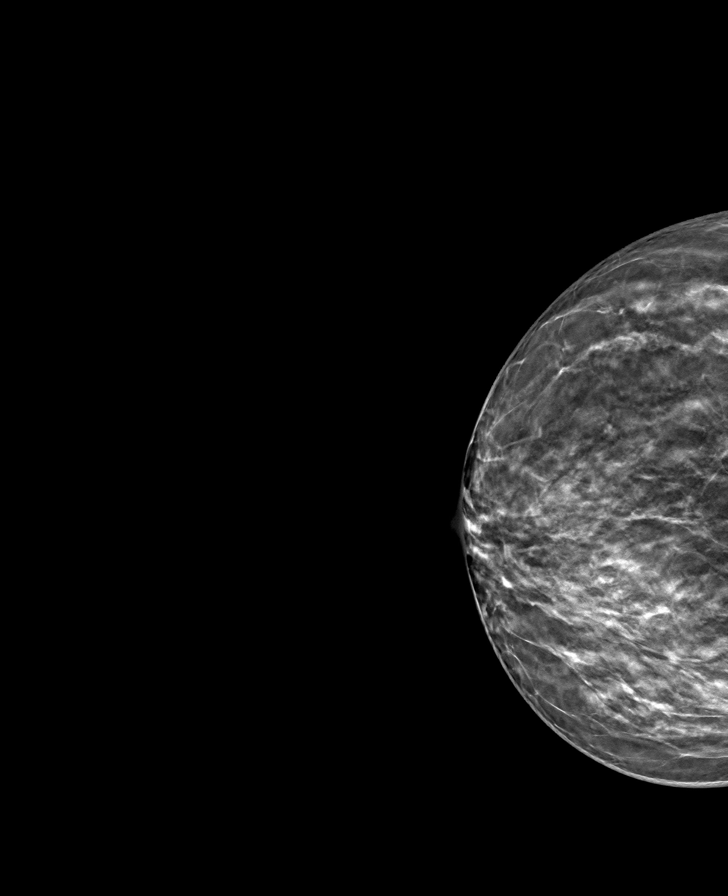

[L CC tomo · tomo slice 38/75.0]
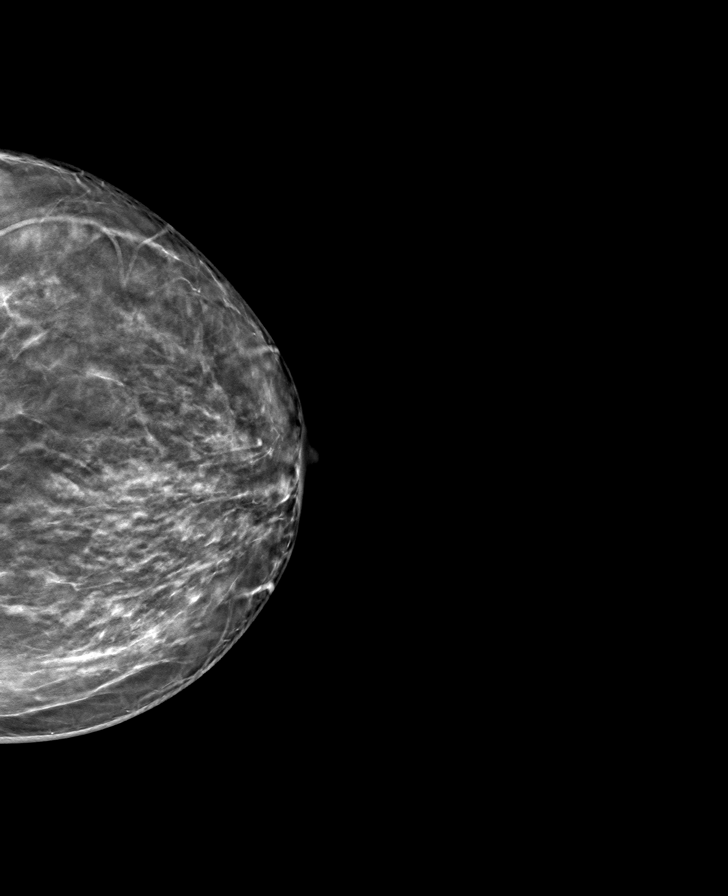

[L MLO tomo · tomo slice 41/80.0]
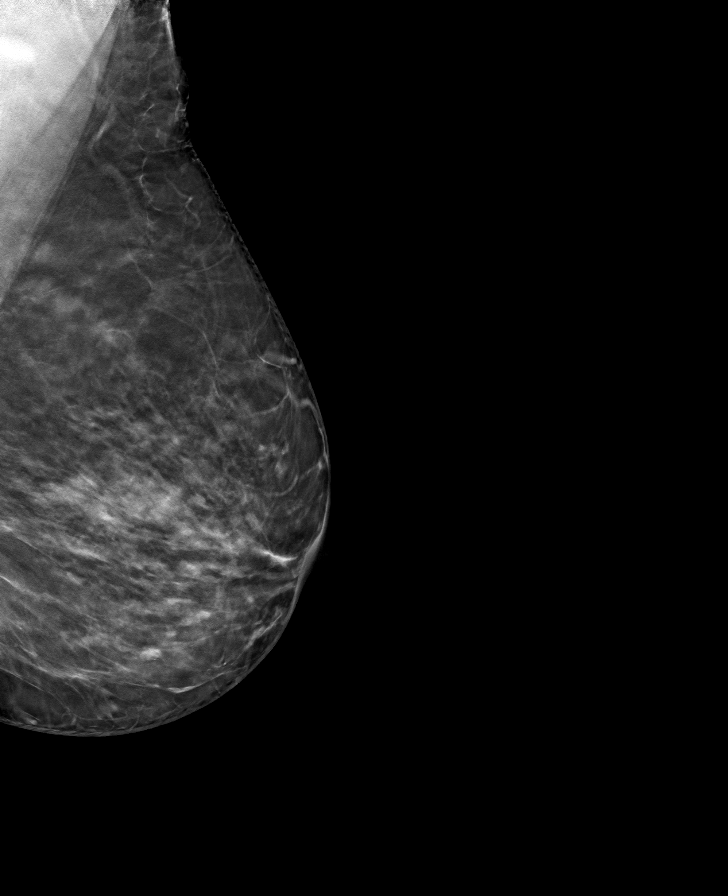

[R MLO tomo · tomo slice 37/74.0]
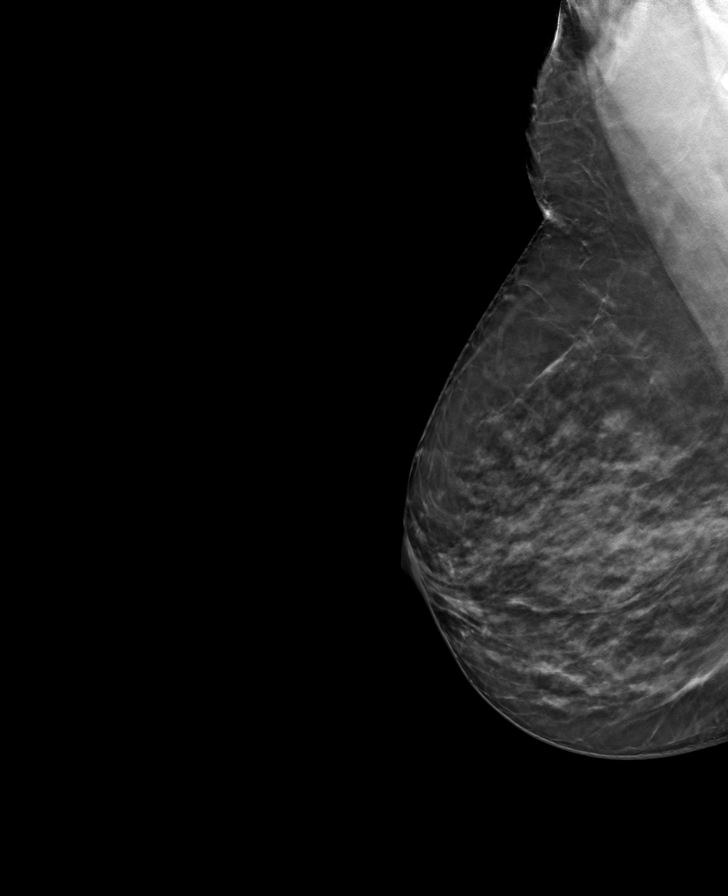

[8 of 24 positions shown; findings below may reference images not displayed]

ACR Breast Density Category c: The breast tissue is heterogeneously
dense, which may obscure small masses.
FINDINGS: There are no findings suspicious for malignancy.
IMPRESSION: No mammographic evidence of malignancy. A result letter of this
screening mammogram will be mailed directly to the patient.

RECOMMENDATION:
Screening mammogram in one year. (Code:[V2])

BI-RADS CATEGORY  1: Negative.

## 2020-12-27 ENCOUNTER — Other Ambulatory Visit (INDEPENDENT_AMBULATORY_CARE_PROVIDER_SITE_OTHER): Payer: Federal, State, Local not specified - PPO

## 2020-12-27 ENCOUNTER — Ambulatory Visit: Payer: Federal, State, Local not specified - PPO | Admitting: Physician Assistant

## 2020-12-27 ENCOUNTER — Other Ambulatory Visit: Payer: Self-pay

## 2020-12-27 ENCOUNTER — Encounter: Payer: Self-pay | Admitting: Physician Assistant

## 2020-12-27 VITALS — BP 128/86 | HR 64 | Resp 20 | Ht 62.0 in | Wt 147.0 lb

## 2020-12-27 DIAGNOSIS — R413 Other amnesia: Secondary | ICD-10-CM | POA: Diagnosis not present

## 2020-12-27 LAB — AMMONIA: Ammonia: 18 umol/L (ref 11–35)

## 2020-12-27 LAB — C-REACTIVE PROTEIN: CRP: <1 mg/dL (ref 0.5–20.0)

## 2020-12-27 LAB — SEDIMENTATION RATE: Sed Rate: 18 mm/hr (ref 0–30)

## 2020-12-27 NOTE — Progress Notes (Addendum)
Assessment/Plan:   Wendy Sparks is a 50 y.o. year old female with risk factors including  anxiety, history of migraines (Dr. Neale Burly),  hyperlipidemia, seen today for evaluation of memory loss. MoCA today is 8/30  with deficiencies in visuospatial/executive, memory, attention, language, abstraction, delayed recall 0/5, orientation  2/6  suspicious for early onset dementia. MRI of the brain 12/20/2020 remarkable for parenchymal volume loss greater than expected for age, and minor chronic microvascular ischemic changes, as well as prominent pituitary seen. Further evaluation will be done for early onset dementia, with plans for starting medication after studies complete.    Recommendations:   Memory Loss   LP CSF for  cell count, protein and glucose, gram stain and culture,  IG synthesis, oligoclonal  bands, HSV, VDRL< cryptococcal Ag, fungal culture, cytology          and [facility] autoimmune and Alzheimer's disease panel Additional blood work fincluding RPR, ammonia, ESR, CRP, ANA, HIV Routine EEG  MRI of the sella is recommended for further evaluation of the pituitary as per PCP  Discussed safety both in and out of the home.  Discussed the importance of regular daily schedule to maintain brain function.  Continue to monitor mood with PCP.  Stay active at least 30 minutes at least 3 times a week.  Naps should be scheduled and should be no longer than 60 minutes and should not occur after 2 PM.  Folllow up once results above are available   Subjective:    The patient is seen in neurologic consultation at the request of Sparks, Wendy, [place] for the evaluation of memory.  The patient is accompanied by husband who supplements the history. She is a 50 year old RH woman who had memory difficulties for the last 3 years. Her husband reported that after having to spend one year in [place] for work, and now working from home, was able to notice significant cognitive decline in the patient,  "a big difference in the way she thinks and performs tasks". "Her eyes are glassier than before, I can tell when she has a bad day". Her long term memory is better than the short term-husband says. She has trouble recalling simple words names, recent conversations, and lately, she has forgotten how to set the dinner table,  and has been finding things in different drawers than the ones they should go. She has also does not remember how to use some home appliances such as the washing machine. She stopped driving because she has gotten lost thee times despite having the GPS, which frustrated her. She no longer cooks on her own, they do it together, because she does not remember common recipes. Her appetite is good and denies trouble swallowing. He denies her leaving the stove on. He husband does the bills and now, he administers her meds. She is independent of dressing and bathing. Ambulates without difficulty without walker or cane. Denies any falls or prior concussions. Denies headaches, double vision, dizziness, focal numbness or tingling, unilateral weakness or tremors. Denies urine incontinence or retention, constipation or diarrhea. Denies anosmia. Denies history of OSA, ETOH or tobacco. Tobacco. Family History remarkable for mother who had Alzheimer's and passed in her 62s   Labs on 12/09/2020 TSH 1.03 Vitamin D 25 OH 24.3 (low) Vitamin B12 was 764 Folic acid greater than 20 normal Chemistries normal CBC normal  MRI of the brain 12/20/2020 Parenchymal volume loss greater than expected for age. Minor chronic microvascular ischemic changes. Prominent pituitary  for which dedicated MRI of the sella is recommended.    Allergies  Allergen Reactions   Fluconazole Other (See Comments)    Current Outpatient Medications  Medication Instructions   aspirin-acetaminophen-caffeine (EXCEDRIN MIGRAINE) 250-250-65 MG tablet Oral, Every 6 hours PRN   atorvastatin (LIPITOR) 10 mg, Oral, Daily    cyclobenzaprine (FLEXERIL) 5 MG tablet TAKE 1 TABLET BY MOUTH 2 TIMES A DAY AS NEEDED, LIMIT TO 2 DAYS PER WEEK     VITALS:   Vitals:   12/27/20 0755  BP: 128/86  Pulse: 64  Resp: 20  SpO2: 98%  Weight: 147 lb (66.7 kg)  Height: $Remove'5\' 2"'eBthXFl$  (1.575 m)   No flowsheet data found.  PHYSICAL EXAM   HEENT:  Normocephalic, atraumatic. The mucous membranes are moist. The superficial temporal arteries are without ropiness or tenderness. Cardiovascular: Regular rate and rhythm. Lungs: Clear to auscultation bilaterally. Neck: There are no carotid bruits noted bilaterally.  NEUROLOGICAL: Montreal Cognitive Assessment  12/27/2020  Visuospatial/ Executive (0/5) 0  Naming (0/3) 3  Attention: Read list of digits (0/2) 1  Attention: Read list of letters (0/1) 0  Attention: Serial 7 subtraction starting at 100 (0/3) 0  Language: Repeat phrase (0/2) 0  Language : Fluency (0/1) 0  Abstraction (0/2) 2  Delayed Recall (0/5) 0  Orientation (0/6) 2  Total 8  Adjusted Score (based on education) 8   No flowsheet data found.  No flowsheet data found.   Orientation:  Alert and oriented to person, not to place and time. No aphasia or dysarthria. Fund of knowledge is reduced. Recent memory and remote memory impaired  Attention and concentration are. reduced  Unable to name objects and repeat phrases. Delayed recall  0/5 Cranial nerves: There is good facial symmetry. Extraocular muscles are intact and visual fields are full to confrontational testing. Speech is fluent and clear. Soft palate rises symmetrically and there is no tongue deviation. Hearing is intact to conversational tone. Tone: Tone is good throughout. Sensation: Sensation is intact to light touch and pinprick throughout. Vibration is intact at the bilateral big toe.There is no extinction with double simultaneous stimulation. There is no sensory dermatomal level identified. Coordination: The patient has no difficulty with RAM's or FNF  bilaterally. Normal finger to nose  Motor: Strength is 5/5 in the bilateral upper and lower extremities. There is no pronator drift. There are no fasciculations noted. DTR's: Deep tendon reflexes are 2/4 at the bilateral biceps, triceps, brachioradialis, patella and achilles.  Plantar responses are downgoing bilaterally. Gait and Station: The patient is able to ambulate without difficulty.The patient is able to heel toe walk without any difficulty.The patient is able to ambulate in a tandem fashion. The patient is able to stand in the Romberg position.     Thank you for allowing [place] the opportunity to participate in the care of this nice patient. Please do not hesitate to contact us for any questions or concerns.   Total time spent on today's visit was  60 minutes, including both face-to-face time and nonface-to-face time.  Time included that spent on review of records (prior notes available to me/labs/imaging if pertinent), discussing treatment and goals, answering patient's questions and coordinating care.  Cc:  Lennie Odor, PA  Sharene Butters 12/27/2020 3:50 PM

## 2020-12-27 NOTE — Patient Instructions (Addendum)
It was a pleasure to see you today at our office.   Recommendations:  Check labs today RPR, ammonia, ESR, CRP, ANA, HIV LP to see of there is any information that help the diagnosis  Follow up once the results of the above are available about 3 month  RECOMMENDATIONS FOR ALL PATIENTS WITH MEMORY PROBLEMS: 1. Continue to exercise (Recommend 30 minutes of walking everyday, or 3 hours every week) 2. Increase social interactions - continue going to [place] and enjoy social gatherings with friends and family 3. Eat healthy, avoid fried foods and eat more fruits and vegetables 4. Maintain adequate blood pressure, blood sugar, and blood cholesterol level. Reducing the risk of stroke and cardiovascular disease also helps promoting better memory. 5. Avoid stressful situations. Live a simple life and avoid aggravations. Organize your time and prepare for the next day in anticipation. 6. Sleep well, avoid any interruptions of sleep and avoid any distractions in the bedroom that may interfere with adequate sleep quality 7. Avoid sugar, avoid sweets as there is a strong link between excessive sugar intake, diabetes, and cognitive impairment We discussed the Mediterranean diet, which has been shown to help patients reduce the risk of progressive memory disorders and reduces cardiovascular risk. This includes eating fish, eat fruits and green leafy vegetables, nuts like almonds and hazelnuts, walnuts, and also use olive oil. Avoid fast foods and fried foods as much as possible. Avoid sweets and sugar as sugar use has been linked to worsening of memory function.  There is always a concern of gradual progression of memory problems. If this is the case, then we may need to adjust level of care according to patient needs. Support, both to the patient and caregiver, should then be put into place.      You have been referred for a neuropsychological evaluation (i.e., evaluation of memory and thinking abilities).  Please bring someone with you to this appointment if possible, as it is helpful for the doctor to hear from both you and another adult who knows you well. Please bring eyeglasses and hearing aids if you wear them.    The evaluation will take approximately 3 hours and has two parts:   The first part is a clinical interview with the neuropsychologist (Dr. Melvyn Novas or Dr. Nicole Kindred). During the interview, the neuropsychologist will speak with you and the individual you brought to the appointment.    The second part of the evaluation is testing with the doctor's technician Hinton Dyer or Maudie Mercury). During the testing, the technician will ask you to remember different types of material, solve problems, and answer some questionnaires. Your family member will not be present for this portion of the evaluation.   Please note: We must reserve several hours of the neuropsychologist's time and the psychometrician's time for your evaluation appointment. As such, there is a No-Show fee of $100. If you are unable to attend any of your appointments, please contact our office as soon as possible to reschedule.    FALL PRECAUTIONS: Be cautious when walking. Scan the area for obstacles that may increase the risk of trips and falls. When getting up in the mornings, sit up at the edge of the bed for a few minutes before getting out of bed. Consider elevating the bed at the head end to avoid drop of blood pressure when getting up. Walk always in a well-lit room (use night lights in the walls). Avoid area rugs or power cords from appliances in the middle of the walkways. Use  a walker or a cane if necessary and consider physical therapy for balance exercise. Get your eyesight checked regularly.  FINANCIAL OVERSIGHT: Supervision, especially oversight when making financial decisions or transactions is also recommended.  HOME SAFETY: Consider the safety of the kitchen when operating appliances like stoves, microwave oven, and blender. Consider  having supervision and share cooking responsibilities until no longer able to participate in those. Accidents with firearms and other hazards in the house should be identified and addressed as well.   ABILITY TO BE LEFT ALONE: If patient is unable to contact 911 operator, consider using LifeLine, or when the need is there, arrange for someone to stay with patients. Smoking is a fire hazard, consider supervision or cessation. Risk of wandering should be assessed by caregiver and if detected at any point, supervision and safe proof recommendations should be instituted.  MEDICATION SUPERVISION: Inability to self-administer medication needs to be constantly addressed. Implement a mechanism to ensure safe administration of the medications.   DRIVING: Regarding driving, in patients with progressive memory problems, driving will be impaired. We advise to have someone else do the driving if trouble finding directions or if minor accidents are reported. Independent driving assessment is available to determine safety of driving.   If you are interested in the driving assessment, you can contact the following:  [employer] in [place]  [place] [phone] or [phone]    [place] refers to food and lifestyle choices that are based on the traditions of countries located on the [employer]. This way of eating has been shown to help prevent certain conditions and improve outcomes for people who have chronic diseases, like kidney disease and heart disease. What are tips for following this plan? Lifestyle  Cook and eat meals together with your family, when possible. Drink enough fluid to keep your urine clear or pale yellow. Be physically active every day. This includes: Aerobic exercise like running or swimming. Leisure activities like  gardening, walking, or housework. Get 7-8 hours of sleep each night. If recommended by your health care provider, drink red wine in moderation. This means 1 glass a day for nonpregnant women and 2 glasses a day for men. A glass of wine equals 5 oz (150 mL). Reading food labels  Check the serving size of packaged foods. For foods such as rice and pasta, the serving size refers to the amount of cooked product, not dry. Check the total fat in packaged foods. Avoid foods that have saturated fat or trans fats. Check the ingredients list for added sugars, such as corn syrup. Shopping  At the grocery store, buy most of your food from the areas near the walls of the store. This includes: Fresh fruits and vegetables (produce). Grains, beans, nuts, and seeds. Some of these may be available in unpackaged forms or large amounts (in bulk). Fresh seafood. Poultry and eggs. Low-fat dairy products. Buy whole ingredients instead of prepackaged foods. Buy fresh fruits and vegetables in-season from local farmers markets. Buy frozen fruits and vegetables in resealable bags. If you do not have access to quality fresh seafood, buy precooked frozen shrimp or canned fish, such as tuna, salmon, or sardines. Buy small amounts of raw or cooked vegetables, salads, or olives from the deli or salad bar at your store. Stock your pantry so you always have certain foods on hand, such as olive oil, canned tuna, canned tomatoes, rice, pasta,  and beans. Cooking  Cook foods with extra-virgin olive oil instead of using butter or other vegetable oils. Have meat as a side dish, and have vegetables or grains as your main dish. This means having meat in small portions or adding small amounts of meat to foods like pasta or stew. Use beans or vegetables instead of meat in common dishes like chili or lasagna. Experiment with different cooking methods. Try roasting or broiling vegetables instead of steaming or sauteing them. Add frozen  vegetables to soups, stews, pasta, or rice. Add nuts or seeds for added healthy fat at each meal. You can add these to yogurt, salads, or vegetable dishes. Marinate fish or vegetables using olive oil, lemon juice, garlic, and fresh herbs. Meal planning  Plan to eat 1 vegetarian meal one day each week. Try to work up to 2 vegetarian meals, if possible. Eat seafood 2 or more times a week. Have healthy snacks readily available, such as: Vegetable sticks with hummus. Greek yogurt. Fruit and nut trail mix. Eat balanced meals throughout the week. This includes: Fruit: 2-3 servings a day Vegetables: 4-5 servings a day Low-fat dairy: 2 servings a day Fish, poultry, or lean meat: 1 serving a day Beans and legumes: 2 or more servings a week Nuts and seeds: 1-2 servings a day Whole grains: 6-8 servings a day Extra-virgin olive oil: 3-4 servings a day Limit red meat and sweets to only a few servings a month What are my food choices? Mediterranean diet Recommended Grains: Whole-grain pasta. Brown rice. Bulgar wheat. Polenta. Couscous. Whole-wheat bread. [place]. Vegetables: Artichokes. Beets. Broccoli. Cabbage. Carrots. Eggplant. Green beans. Chard. Kale. Spinach. Onions. Leeks. Peas. Squash. Tomatoes. Peppers. Radishes. Fruits: Apples. Apricots. Avocado. Berries. Bananas. Cherries. Dates. Figs. Grapes. Lemons. Melon. Oranges. Peaches. Plums. Pomegranate. Meats and other protein foods: Beans. Almonds. Sunflower seeds. Pine nuts. Peanuts. [place]. Salmon. Scallops. Shrimp. [place]. Tilapia. Clams. Oysters. Eggs. Dairy: Low-fat milk. Cheese. Greek yogurt. Beverages: Water. Red wine. Herbal tea. Fats and oils: Extra virgin olive oil. Avocado oil. Grape seed oil. Sweets and desserts: [place] yogurt with honey. Baked apples. Poached pears. Trail mix. Seasoning and other foods: Basil. Cilantro. Coriander. Cumin. Mint. Parsley. Sage. Rosemary. Tarragon. Garlic. Oregano. Thyme. Pepper. Balsalmic vinegar.  Tahini. Hummus. Tomato sauce. Olives. Mushrooms. Limit these Grains: Prepackaged pasta or rice dishes. Prepackaged cereal with added sugar. Vegetables: Deep fried potatoes (french fries). Fruits: Fruit canned in syrup. Meats and other protein foods: Beef. Pork. Lamb. Poultry with skin. Hot dogs. [place] Salines. Dairy: Ice cream. Sour cream. Whole milk. Beverages: Juice. Sugar-sweetened soft drinks. Beer. Liquor and spirits. Fats and oils: Butter. Canola oil. Vegetable oil. Beef fat (tallow). Lard. Sweets and desserts: Cookies. Cakes. Pies. Candy. Seasoning and other foods: Mayonnaise. Premade sauces and marinades. The items listed may not be a complete list. Talk with your dietitian about what dietary choices are right for you. Summary The Mediterranean diet includes both food and lifestyle choices. Eat a variety of fresh fruits and vegetables, beans, nuts, seeds, and whole grains. Limit the amount of red meat and sweets that you eat. Talk with your health care provider about whether it is safe for you to drink red wine in moderation. This means 1 glass a day for nonpregnant women and 2 glasses a day for men. A glass of wine equals 5 oz (150 mL). This information is not intended to replace advice given to you by your health care provider. Make sure you discuss any questions you have with your health care provider.  Document Released: 12/09/2015 Document Revised: 01/11/2016 Document Reviewed: 12/09/2015 Elsevier Interactive Patient Education  2017 [employer].

## 2020-12-28 LAB — ANA W/REFLEX: Anti Nuclear Antibody (ANA): NEGATIVE

## 2020-12-29 LAB — HIV-1 RNA QUANT-NO REFLEX-BLD
HIV 1 RNA Quant: NOT DETECTED Copies/mL
HIV-1 RNA Quant, Log: NOT DETECTED Log cps/mL

## 2020-12-29 LAB — RPR: RPR Ser Ql: NONREACTIVE

## 2020-12-29 NOTE — Progress Notes (Signed)
Left message to call office at 1035 12/29/2020

## 2020-12-29 NOTE — Progress Notes (Signed)
Left message again at 142 12/29/2020

## 2021-01-04 NOTE — Progress Notes (Signed)
Left message of labs normal.

## 2021-01-07 ENCOUNTER — Ambulatory Visit
Admission: RE | Admit: 2021-01-07 | Discharge: 2021-01-07 | Disposition: A | Discharge status: Home / Self Care | Payer: Federal, State, Local not specified - PPO | Source: Ambulatory Visit | Attending: Physician Assistant | Admitting: Physician Assistant

## 2021-01-07 ENCOUNTER — Other Ambulatory Visit: Payer: Self-pay | Admitting: Physician Assistant

## 2021-01-07 ENCOUNTER — Other Ambulatory Visit (HOSPITAL_COMMUNITY)
Admission: RE | Admit: 2021-01-07 | Discharge: 2021-01-07 | Disposition: A | Discharge status: Home / Self Care | Payer: Federal, State, Local not specified - PPO | Source: Ambulatory Visit | Attending: Neurology | Admitting: Neurology

## 2021-01-07 ENCOUNTER — Other Ambulatory Visit: Payer: Self-pay

## 2021-01-07 DIAGNOSIS — R413 Other amnesia: Secondary | ICD-10-CM

## 2021-01-07 IMAGING — XA DG SPINAL PUNCT LUMBAR DIAG WITH FL CT GUIDANCE
4 series · 4 of 4 positions shown · non-contrast
Comparison: none

CLINICAL DATA: Memory loss

[Series 1: ortho standard · 1 of 1 slices shown (1 of 4)]
[im 1/1]
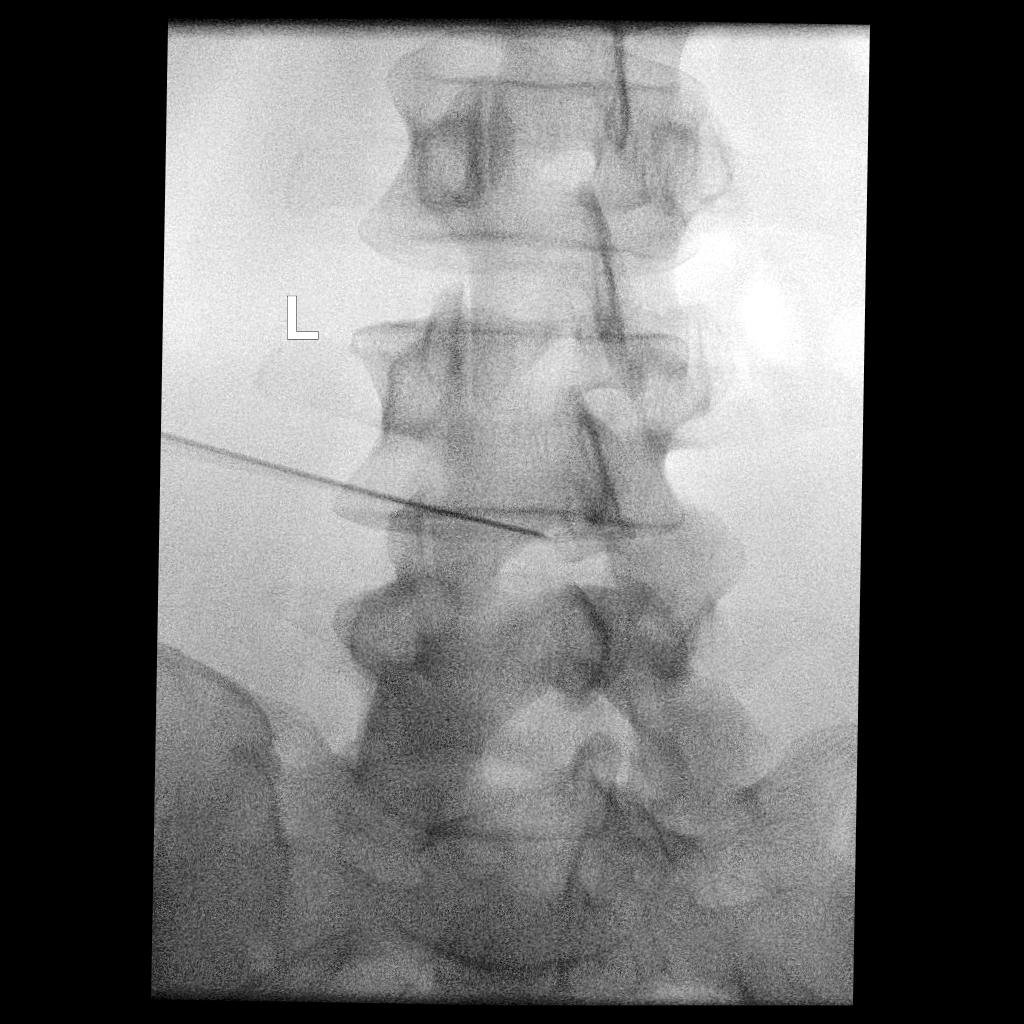

[Series 2: ortho standard · 1 of 1 slices shown (2 of 4)]
[im 1/1]
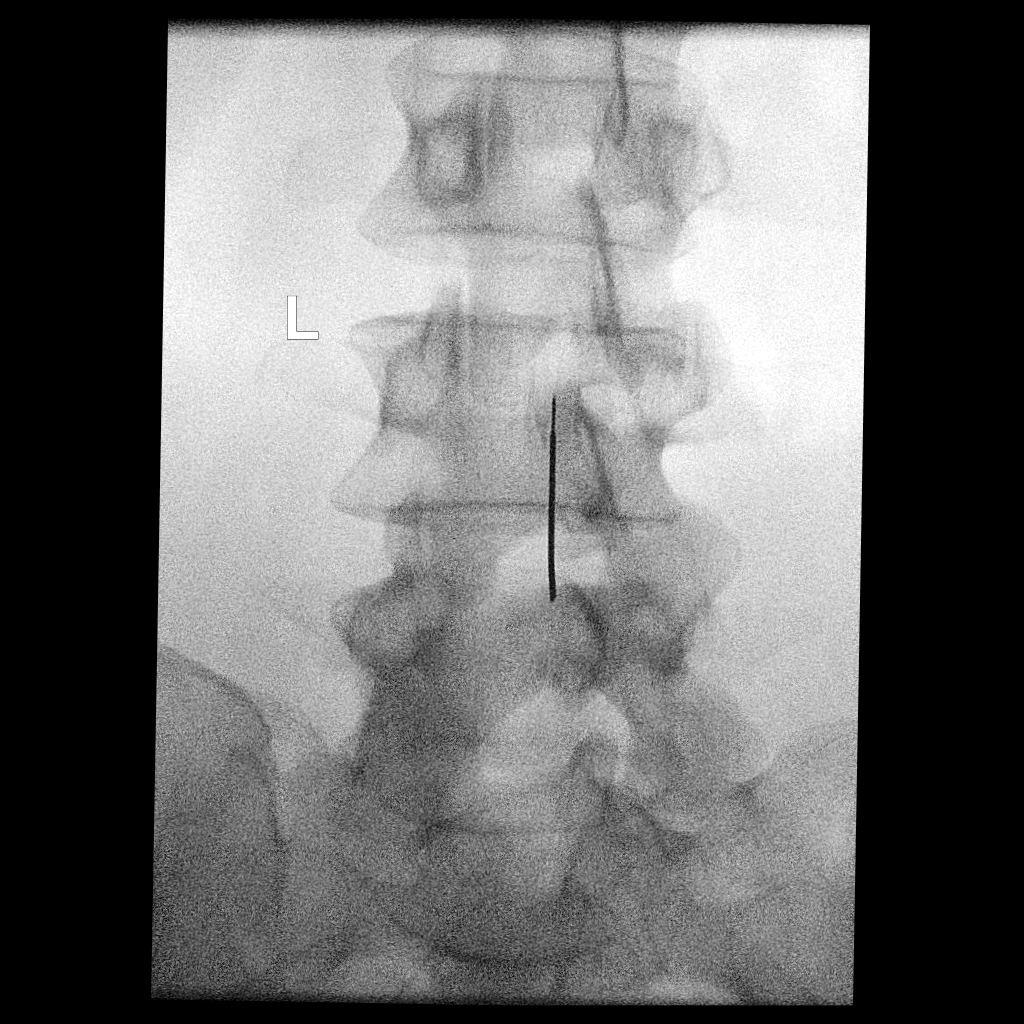

[Series 3: ortho standard · 1 of 1 slices shown (3 of 4)]
[im 1/1]
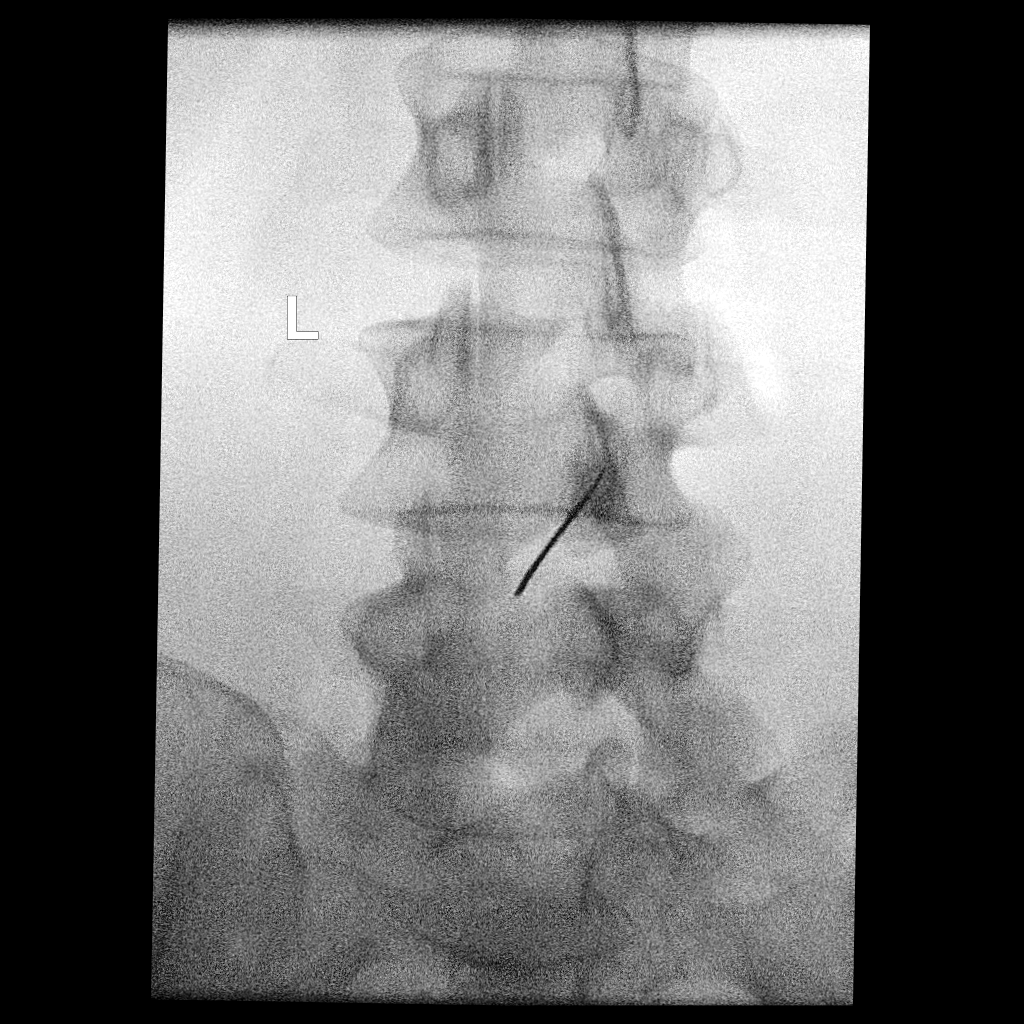

[Series 4: ortho standard · 1 of 1 slices shown (4 of 4)]
[im 1/1]
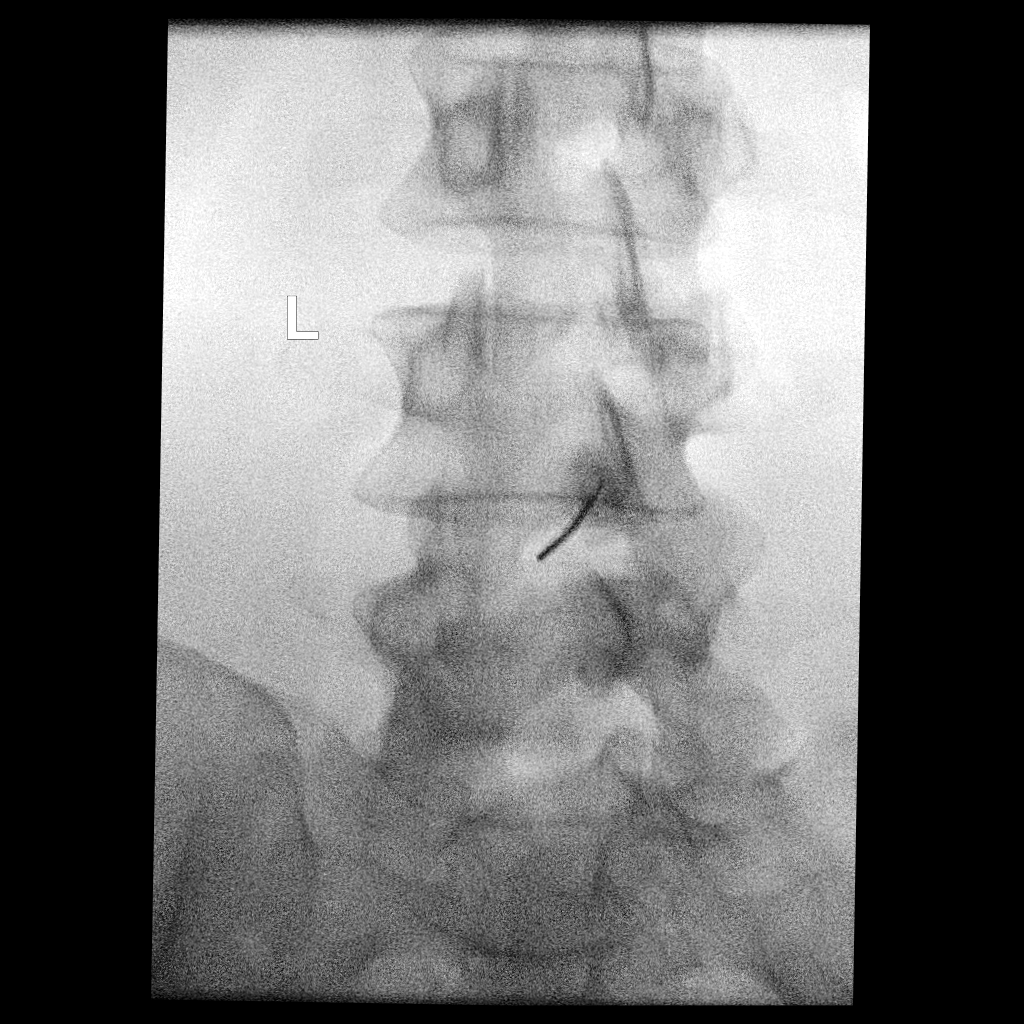

[4 of 4 positions shown; findings below may reference images not displayed]

EXAM:
DIAGNOSTIC LUMBAR PUNCTURE UNDER FLUOROSCOPIC GUIDANCE

FLUOROSCOPY TIME:  40 seconds; 37  [BK] DAP

PROCEDURE:
Informed consent was obtained from the patient prior to the
procedure, including potential complications of headache, allergy,
and pain. With the patient prone, the lower back was prepped with
Betadine. 1% Lidocaine was used for local anesthesia. Lumbar
puncture was performed at the L4-5 level using a left parasagittal
approach with a 20 gauge needle with return of clear colorless CSF
with an opening pressure of 12 cm water. 17 ml of CSF were obtained
for laboratory studies. The patient tolerated the procedure well and
there were no apparent complications.

Primary operator: KAUANASSUNCAO PA
IMPRESSION: Technically successful lumbar puncture under fluoroscopy

## 2021-01-07 NOTE — Progress Notes (Signed)
1 vial of blood drawn from pts LAC to be sent off with LP lab work. 1 successful attempt, pt tolerated well. Gauze and tape applied after. 

## 2021-01-07 NOTE — Discharge Instructions (Signed)

## 2021-01-11 LAB — CYTOLOGY - NON PAP

## 2021-01-14 LAB — MAYO MISC ORDER 2

## 2021-02-07 LAB — CRYPTOCOCCAL AG, LTX SCR RFLX TITER
Cryptococcal Ag Screen: NOT DETECTED
MICRO NUMBER:: 12353097
SPECIMEN QUALITY:: ADEQUATE

## 2021-02-07 LAB — CNS IGG SYNTHESIS RATE, CSF+BLOOD
Albumin Serum: 3.9 g/dL (ref 3.5–5.2)
Albumin, CSF: 16.2 mg/dL (ref 8.0–42.0)
CNS-IgG Synthesis Rate: -1.6 mg/24 h (ref <=3.3)
IgG (Immunoglobin G), Serum: 963 mg/dL (ref 600–1640)
IgG Total CSF: 2.2 mg/dL (ref 0.8–7.7)
IgG-Index: 0.55 (ref <0.66)

## 2021-02-07 LAB — MAYO MISC ORDER: PRICE:: 1691.3

## 2021-02-07 LAB — CSF CULTURE W GRAM STAIN
GRAM STAIN:: NONE SEEN
MICRO NUMBER:: 12353099
Result:: NO GROWTH
SPECIMEN QUALITY:: ADEQUATE

## 2021-02-07 LAB — CSF CELL COUNT WITH DIFFERENTIAL
RBC Count, CSF: 2 cells/uL — ABNORMAL HIGH
WBC, CSF: 0 cells/uL (ref 0–5)

## 2021-02-07 LAB — PROTEIN, CSF: Total Protein, CSF: 41 mg/dL (ref 15–45)

## 2021-02-07 LAB — FUNGUS CULTURE W SMEAR
CULTURE:: NO GROWTH
MICRO NUMBER:: 12353098
SMEAR:: NONE SEEN
SPECIMEN QUALITY:: ADEQUATE

## 2021-02-07 LAB — HERPES SIMPLEX VIRUS 1/2 (IGG), CSF
HSV 1 IgG Index:: <0.01
HSV 2 IgG Index:: 0.35

## 2021-02-07 LAB — VDRL, CSF: VDRL Quant, CSF: NONREACTIVE

## 2021-02-07 LAB — OLIGOCLONAL BANDS, CSF + SERM

## 2021-02-07 LAB — GLUCOSE, CSF: Glucose, CSF: 62 mg/dL (ref 40–80)

## 2021-03-14 ENCOUNTER — Telehealth: Payer: Self-pay | Admitting: Physician Assistant

## 2021-03-14 NOTE — Telephone Encounter (Signed)
I didn't see  his name on DPR.I left message for patient to let her know results aren't available yet.

## 2021-03-14 NOTE — Telephone Encounter (Signed)
Patient's spouse Romeo Apple called requesting a call back about results for LP.   Patient came on the line and gave verbal permission for the nurse to call back and speak with her husband about these results.

## 2021-03-16 NOTE — Telephone Encounter (Signed)
Pt husband called to speak with christy regarding results.

## 2021-03-18 ENCOUNTER — Other Ambulatory Visit: Payer: Self-pay | Admitting: Physician Assistant

## 2021-03-18 MED ORDER — DONEPEZIL HCL 10 MG PO TABS: ORAL_TABLET | ORAL | 11 refills | Status: DC

## 2021-03-18 NOTE — Progress Notes (Signed)
Spoke with husband  (POA)  regarding dx dementia. We will start donepezil half tablet (5mg ) daily for 2  weeks.  If you are tolerating the medication, then after 2 weeks, we will increase the dose to a full tablet of 10 mg daily.  Side effects include nausea, vomiting, diarrhea, vivid dreams, and muscle cramps.  Instructed to call the clinic if you experience any of these symptoms.

## 2021-03-29 ENCOUNTER — Encounter: Payer: Self-pay | Admitting: Physician Assistant

## 2021-03-29 ENCOUNTER — Ambulatory Visit (INDEPENDENT_AMBULATORY_CARE_PROVIDER_SITE_OTHER): Payer: Federal, State, Local not specified - PPO | Admitting: Physician Assistant

## 2021-03-29 ENCOUNTER — Other Ambulatory Visit: Payer: Self-pay

## 2021-03-29 VITALS — BP 120/84 | HR 85 | Resp 18 | Ht 62.0 in | Wt 152.0 lb

## 2021-03-29 DIAGNOSIS — R413 Other amnesia: Secondary | ICD-10-CM | POA: Diagnosis not present

## 2021-03-29 DIAGNOSIS — E7800 Pure hypercholesterolemia, unspecified: Secondary | ICD-10-CM | POA: Insufficient documentation

## 2021-03-29 DIAGNOSIS — F419 Anxiety disorder, unspecified: Secondary | ICD-10-CM | POA: Insufficient documentation

## 2021-03-29 DIAGNOSIS — K5904 Chronic idiopathic constipation: Secondary | ICD-10-CM | POA: Insufficient documentation

## 2021-03-29 NOTE — Progress Notes (Signed)
Assessment/Plan:    Early onset dementia without behavioral disturbance   Discussed safety both in and out of the home.  Discussed the importance of regular daily schedule with inclusion of crossword puzzles to maintain brain function.  Continue to monitor mood  Stay active at least 30 minutes at least 3 times a week.  Naps should be scheduled and should be no longer than 60 minutes and should not occur after 2 PM.  Mediterranean diet is recommended  Continue donepezil 10 mg daily Side effects were discussed LP for Tau and 14-3-3 Follow up in 1-2 months to discuss results  Dedicated MRI of the sella is recommended in view of pituitary enlargement as he can affect her memory.     Case discussed with [name] who agrees with the plan     Subjective:   ED visits since last seen: none  Hospital admissions: none  Wendy Sparks is a 50 y.o. female  seen today in follow up for memory loss. This patient is accompanied in the office by her who supplements the history.  Previous records as well as any outside records available were reviewed prior to todays visit.  She was last seen on August 2022, at which time a MoCA was 7/30.   MRI brain w and wo contrast 11/2020 revealed parenchymal volume loss greater than expected for age as well as minor chronic microvascular ischemic changes and a prominent pituitary LP on 01/07/2021 was negative for autoimmune, protein and infectious disease.She is currently on donepezil 10 mg daily for about 1 week without side effects.  According to her husband, her memory has not gotten better, perhaps "a little worse ".  However, he is more concerned about her temperament, as she becomes more frustrated than before, "and perhaps easily angry ", her husband says.  He continues to repeat the same stories and asked the same questions, but most importantly, there is more concerned about being able to perform activities of daily living, such as remembering how to get  dressed, or how to close his repair, or how to use remote control, or how to operate the washer and dryer.  She no longer drives.  She lives with her husband.  She continues to do crossword puzzles and word finding as often as she can possibly do.  She sleeps well, denies vivid dreams or sleepwalking.  No hallucinations or paranoia.  No hygiene concerns, she is independent of bathing and dressing.  Her husband is in charge of the medications and finances.  Her appetite is good, denies trouble swallowing, she does not cook.  She ambulates without difficulty, without falls or head injuries.Denies headaches, double vision, dizziness, focal numbness or tingling, unilateral weakness or tremors or anosmia. No history of seizures. Denies urine incontinence, retention, constipation or diarrhea.    MRI brain w and wo contrast 11/2020 Parenchymal volume loss greater than expected for age.Minor chronic microvascular ischemic changes.Prominent pituitary for which dedicated MRI of the sella is recommended.  LP 01/07/21 neg for autoimmune and infectious disease   Labs on 12/09/2020 TSH 1.03 Vitamin D 25 OH 24.3 (low) Vitamin B12 was 764 Folic acid greater than 20 normal Chemistries normal CBC normal  PREVIOUS MEDICATIONS:   CURRENT MEDICATIONS:  Outpatient Encounter Medications as of 03/29/2021  Medication Sig   aspirin-acetaminophen-caffeine (EXCEDRIN MIGRAINE) 250-250-65 MG tablet Take by mouth every 6 (six) hours as needed for headache.   atorvastatin (LIPITOR) 10 MG tablet Take 10 mg by mouth daily.   cyclobenzaprine (FLEXERIL)  5 MG tablet TAKE 1 TABLET BY MOUTH 2 TIMES A DAY AS NEEDED, LIMIT TO 2 DAYS PER WEEK   donepezil (ARICEPT) 10 MG tablet Take half tablet (5 mg) daily for 2 weeks, then increase to the full tablet at 10 mg daily   No facility-administered encounter medications on file as of 03/29/2021.     Objective:     PHYSICAL EXAMINATION:    VITALS:   Vitals:   03/29/21 0729  BP:  120/84  Pulse: 85  Resp: 18  SpO2: 100%  Weight: 152 lb (68.9 kg)  Height: 5\' 2"  (1.575 m)    GEN:  The patient appears stated age and is in NAD. HEENT:  Normocephalic, atraumatic.   Neurological examination:  General: NAD, well-groomed, appears stated age. Orientation: The patient is alert. Oriented to person, not to place and time. Cranial nerves: There is good facial symmetry.The speech is fluent and clear. No aphasia or dysarthria. Fund of knowledge is reduced. Recent and remote memory are impaired. Attention and concentration are reduced.  Able to name objects and repeat phrases.  Hearing is intact to conversational tone.    Sensation: Sensation is intact to light touch throughout Motor: Strength is at least antigravity x4. Tremors: none  DTR's 2/4 in UE/LE     Montreal Cognitive Assessment  12/27/2020  Visuospatial/ Executive (0/5) 0  Naming (0/3) 3  Attention: Read list of digits (0/2) 1  Attention: Read list of letters (0/1) 0  Attention: Serial 7 subtraction starting at 100 (0/3) 0  Language: Repeat phrase (0/2) 0  Language : Fluency (0/1) 0  Abstraction (0/2) 2  Delayed Recall (0/5) 0  Orientation (0/6) 2  Total 8  Adjusted Score (based on education) 8   No flowsheet data found.  No flowsheet data found.     Movement examination: Tone: There is normal tone in the UE/LE Abnormal movements:  no tremor.  No myoclonus.  No asterixis.   Coordination:  There is no decremation with RAM's. Normal finger to nose  Gait and Station: The patient has no difficulty arising out of a deep-seated chair without the use of the hands. The patient's stride length is good.  Gait is cautious and narrow.        Total time spent on today's visit was 45 minutes, including both face-to-face time and nonface-to-face time. Time included that spent on review of records (prior notes available to me/labs/imaging if pertinent), discussing treatment and goals, answering patient's questions  and coordinating care.  Cc:  [place], [place], [place], PA-C

## 2021-03-29 NOTE — Patient Instructions (Signed)
It was a pleasure to see you today at our office.   Recommendations:  Follow up in 6 wks LP to schedule  Continue donepezil 10 mg daily.    RECOMMENDATIONS FOR ALL PATIENTS WITH MEMORY PROBLEMS: 1. Continue to exercise (Recommend 30 minutes of walking everyday, or 3 hours every week) 2. Increase social interactions - continue going to [place] and enjoy social gatherings with friends and family 3. Eat healthy, avoid fried foods and eat more fruits and vegetables 4. Maintain adequate blood pressure, blood sugar, and blood cholesterol level. Reducing the risk of stroke and cardiovascular disease also helps promoting better memory. 5. Avoid stressful situations. Live a simple life and avoid aggravations. Organize your time and prepare for the next day in anticipation. 6. Sleep well, avoid any interruptions of sleep and avoid any distractions in the bedroom that may interfere with adequate sleep quality 7. Avoid sugar, avoid sweets as there is a strong link between excessive sugar intake, diabetes, and cognitive impairment We discussed the Mediterranean diet, which has been shown to help patients reduce the risk of progressive memory disorders and reduces cardiovascular risk. This includes eating fish, eat fruits and green leafy vegetables, nuts like almonds and hazelnuts, walnuts, and also use olive oil. Avoid fast foods and fried foods as much as possible. Avoid sweets and sugar as sugar use has been linked to worsening of memory function.  There is always a concern of gradual progression of memory problems. If this is the case, then we may need to adjust level of care according to patient needs. Support, both to the patient and caregiver, should then be put into place.    FALL PRECAUTIONS: Be cautious when walking. Scan the area for obstacles that may increase the risk of trips and falls. When getting up in the mornings, sit up at the edge of the bed for a few minutes before getting out of bed.  Consider elevating the bed at the head end to avoid drop of blood pressure when getting up. Walk always in a well-lit room (use night lights in the walls). Avoid area rugs or power cords from appliances in the middle of the walkways. Use a walker or a cane if necessary and consider physical therapy for balance exercise. Get your eyesight checked regularly.  FINANCIAL OVERSIGHT: Supervision, especially oversight when making financial decisions or transactions is also recommended.  HOME SAFETY: Consider the safety of the kitchen when operating appliances like stoves, microwave oven, and blender. Consider having supervision and share cooking responsibilities until no longer able to participate in those. Accidents with firearms and other hazards in the house should be identified and addressed as well.   ABILITY TO BE LEFT ALONE: If patient is unable to contact 911 operator, consider using LifeLine, or when the need is there, arrange for someone to stay with patients. Smoking is a fire hazard, consider supervision or cessation. Risk of wandering should be assessed by caregiver and if detected at any point, supervision and safe proof recommendations should be instituted.  MEDICATION SUPERVISION: Inability to self-administer medication needs to be constantly addressed. Implement a mechanism to ensure safe administration of the medications.

## 2021-03-30 ENCOUNTER — Other Ambulatory Visit: Payer: Self-pay | Admitting: Physician Assistant

## 2021-03-30 DIAGNOSIS — R413 Other amnesia: Secondary | ICD-10-CM

## 2021-04-01 ENCOUNTER — Ambulatory Visit
Admission: RE | Admit: 2021-04-01 | Discharge: 2021-04-01 | Disposition: A | Discharge status: Home / Self Care | Payer: Federal, State, Local not specified - PPO | Source: Ambulatory Visit | Attending: Physician Assistant | Admitting: Physician Assistant

## 2021-04-01 VITALS — BP 135/58 | HR 80

## 2021-04-01 DIAGNOSIS — R413 Other amnesia: Secondary | ICD-10-CM

## 2021-04-01 DIAGNOSIS — F03C0 Unspecified dementia, severe, without behavioral disturbance, psychotic disturbance, mood disturbance, and anxiety: Secondary | ICD-10-CM | POA: Diagnosis not present

## 2021-04-01 DIAGNOSIS — F0280 Dementia in other diseases classified elsewhere without behavioral disturbance: Secondary | ICD-10-CM | POA: Insufficient documentation

## 2021-04-01 DIAGNOSIS — F0390 Unspecified dementia without behavioral disturbance: Secondary | ICD-10-CM | POA: Insufficient documentation

## 2021-04-01 IMAGING — XA DG SPINAL PUNCT LUMBAR DIAG WITH FL CT GUIDANCE
1 series · 1 of 1 positions shown · non-contrast
Comparison: None

CLINICAL DATA: Memory loss.

EXAM:
DIAGNOSTIC LUMBAR PUNCTURE UNDER FLUOROSCOPIC GUIDANCE

[Series 1: ortho standard · 1 of 1 slices shown]
[im 1/1]
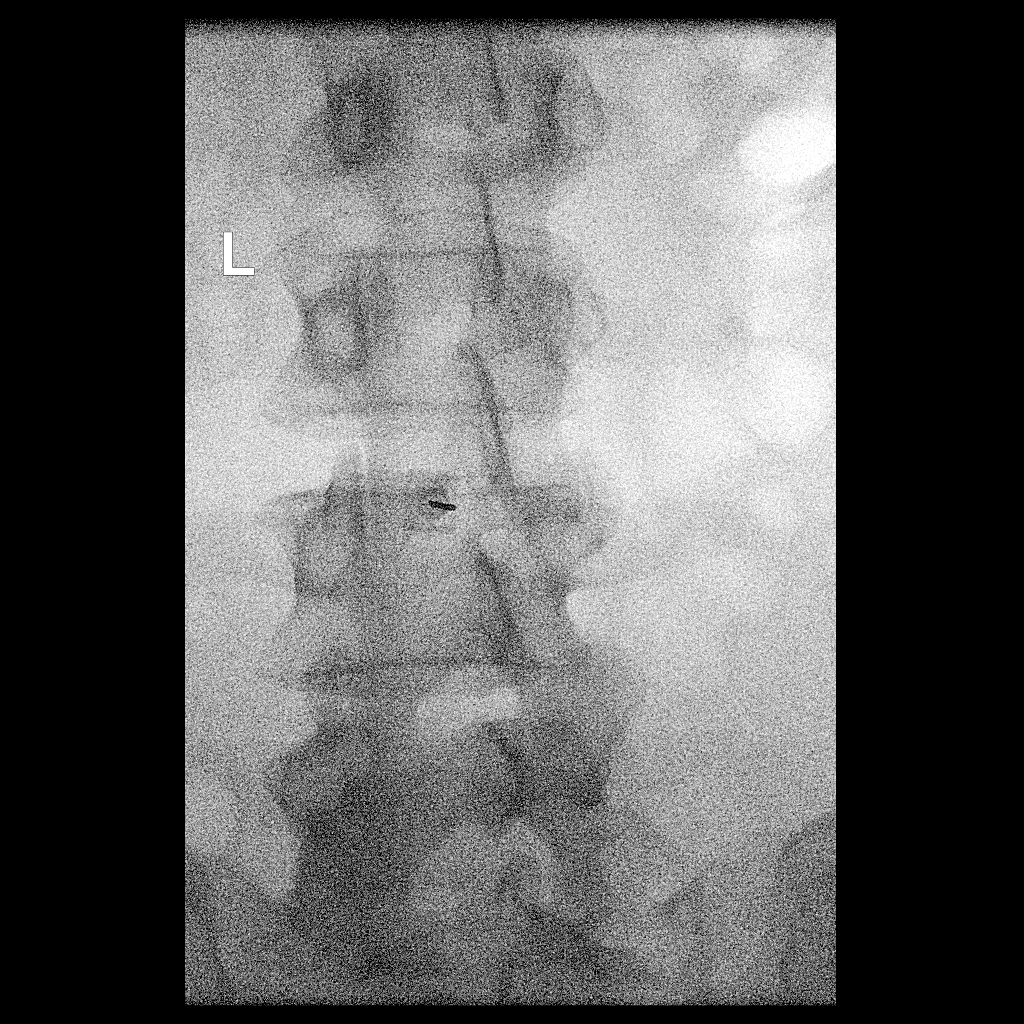

[1 of 1 positions shown; findings below may reference images not displayed]

FLUOROSCOPY TIME:  Fluoroscopy Time:  5 seconds

Radiation Exposure Index (if provided by the fluoroscopic device):
5.15 microGray*m^2

Number of Acquired Spot Images: 0

PROCEDURE:
Informed consent was obtained from the patient prior to the
procedure, including potential complications of headache, allergy,
and pain. With the patient prone, the lower back was prepped with
Betadine. 1% Lidocaine was used for local anesthesia. Lumbar
puncture was performed at the L3-4 level using a 3.5 inch 20 gauge
needle via a left interlaminar approach with return of clear CSF.
10.5 mL of CSF were obtained for laboratory studies. The patient
tolerated the procedure well and there were no apparent
complications.
IMPRESSION: Technically successful fluoroscopically guided lumbar puncture.

## 2021-04-01 NOTE — Progress Notes (Signed)
Urine sample collected from pt. 2 vials of blood drawn from pts RAC to be sent with LP lab work. 1 successful attempt. Pt tolerated well.

## 2021-04-01 NOTE — Discharge Instructions (Signed)

## 2021-04-05 LAB — MAYO MISC ORDER 2

## 2021-04-06 LAB — MAYO MISC ORDER 2: PRICE:: 1240

## 2021-04-09 LAB — CNS IGG SYNTHESIS RATE, CSF+BLOOD
Albumin Serum: 4 g/dL (ref 3.5–5.2)
Albumin, CSF: 18.6 mg/dL (ref 8.0–42.0)
CNS-IgG Synthesis Rate: -2.8 mg/24 h (ref <=3.3)
IgG (Immunoglobin G), Serum: 1040 mg/dL (ref 600–1640)
IgG Total CSF: 2.4 mg/dL (ref 0.8–7.7)
IgG-Index: 0.5 (ref <0.66)

## 2021-04-09 LAB — MAYO MISC ORDER: PRICE:: 568.5

## 2021-04-09 LAB — ANGIOTENSIN CONVERTING ENZYME, CSF: ANGIOTENSIN CONVERTING ENZYME ( ACE) CSF: 9 U/L (ref <=15)

## 2021-04-09 LAB — MYELIN BASIC PROTEIN, CSF: Myelin Basic Protein: <2 mcg/L (ref <=4.0)

## 2021-04-09 LAB — PROTEIN, CSF: Total Protein, CSF: 27 mg/dL (ref 15–45)

## 2021-04-12 LAB — PROTEIN, CSF 14-3-3 (PRION DISEASE)
14-3-3 PROTEIN (CSF)++: 6222 AU/ml — ABNORMAL HIGH (ref 30–1999)
EST PROB PRION DIS IN PATIENT: <1 %
RT-QUIC (CSF)*: NEGATIVE
T-TAU PROTEIN (CSF)++: 865 pg/ml (ref 0–1149)

## 2021-04-15 ENCOUNTER — Other Ambulatory Visit: Payer: Federal, State, Local not specified - PPO

## 2021-04-19 ENCOUNTER — Other Ambulatory Visit: Payer: Self-pay

## 2021-04-19 ENCOUNTER — Ambulatory Visit
Admission: RE | Admit: 2021-04-19 | Discharge: 2021-04-19 | Disposition: A | Discharge status: Home / Self Care | Payer: Federal, State, Local not specified - PPO | Source: Ambulatory Visit | Attending: Physician Assistant | Admitting: Physician Assistant

## 2021-04-19 DIAGNOSIS — R413 Other amnesia: Secondary | ICD-10-CM | POA: Diagnosis not present

## 2021-04-19 IMAGING — MR MR HEAD WO/W CM
13 of 18 series · 36 of 48 positions shown · IV contrast (multihance)
Comparison: MRI head [DATE]

CLINICAL DATA: Memory loss.  Enlarged pituitary on MRI.

EXAM:
MRI HEAD WITHOUT AND WITH CONTRAST
TECHNIQUE: Multiplanar, multiecho pulse sequences of the brain and surrounding
structures were obtained without and with intravenous contrast.
CONTRAST:  7mL MULTIHANCE GADOBENATE DIMEGLUMINE 529 MG/ML IV SOLN

[Series 3: T1 · sagittal · 5.0mm · 0.45mm/px · 1 of 20 slices shown (1 of 3)]
[im 1/20]
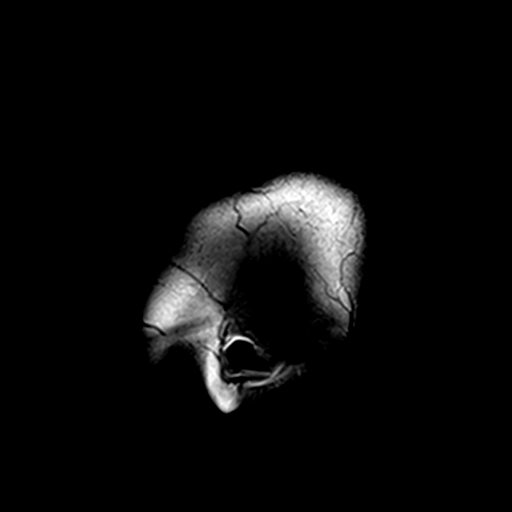

[Series 4: DWI · axial · 3.0mm · 1.80mm/px · z∈[-89,+57]mm · 11 of 100 slices shown]
[im 1/100]
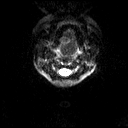
[im 10/100]
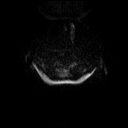
[im 20/100]
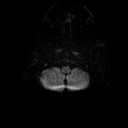
[im 30/100]
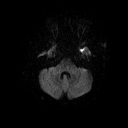
[im 40/100]
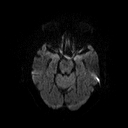
[im 50/100]
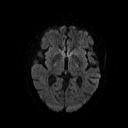
[im 60/100]
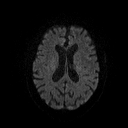
[im 70/100]
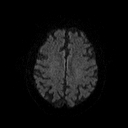
[im 80/100]
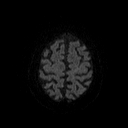
[im 90/100]
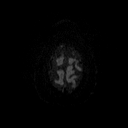
[im 100/100]
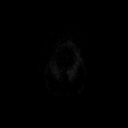

[Series 5: dwi_adc · axial · 3.0mm · 1.80mm/px · z∈[-89,+57]mm · 5 of 50 slices shown]
[im 1/50]
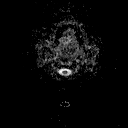
[im 13/50]
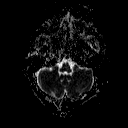
[im 25/50]
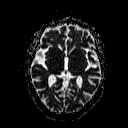
[im 37/50]
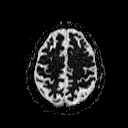
[im 50/50]
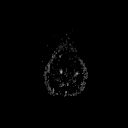

[Series 6: T2 · axial · 5.0mm · 0.36mm/px · z∈[-92,+57]mm · 3 of 24 slices shown]
[im 1/24]
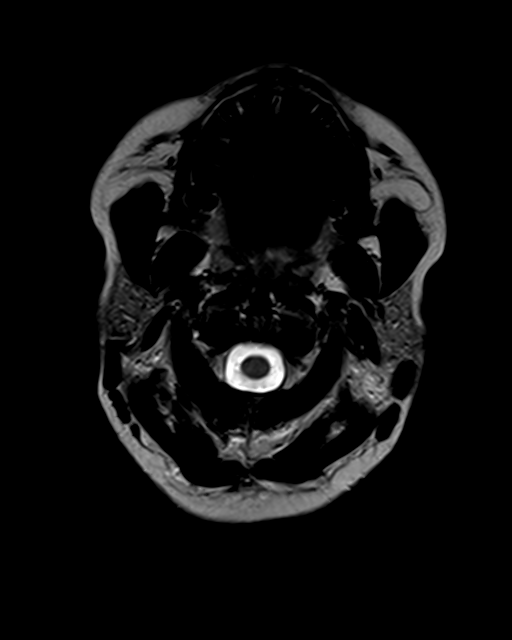
[im 12/24]
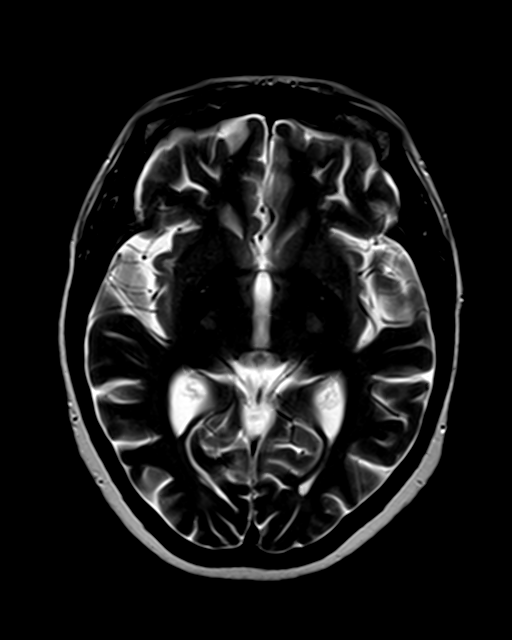
[im 24/24]
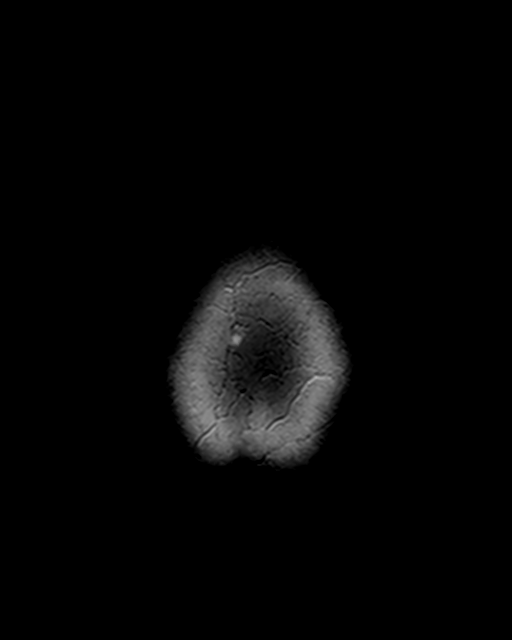

[Series 7: FLAIR · axial · 3.0mm · 0.45mm/px · z∈[-89,+54]mm · 3 of 32 slices shown]
[im 1/32]
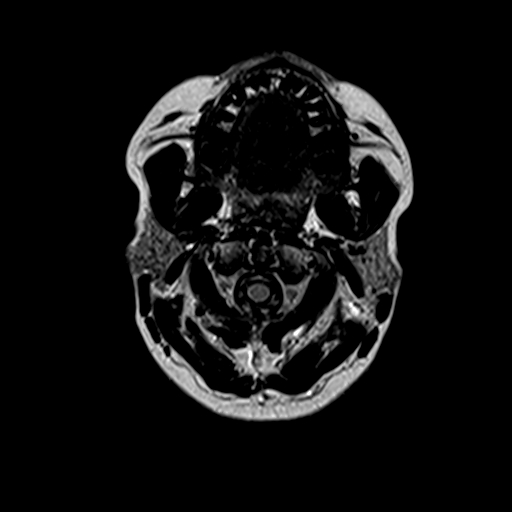
[im 16/32]
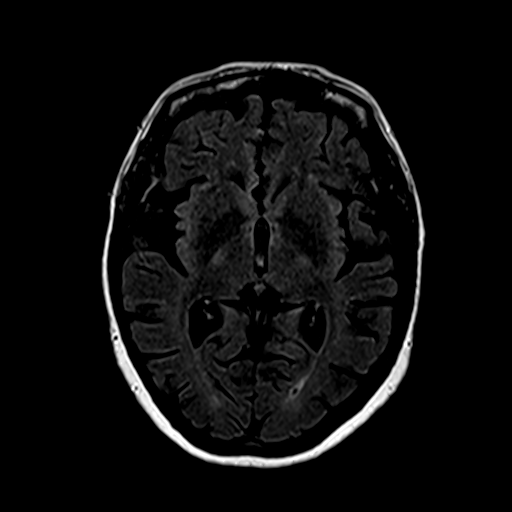
[im 32/32]
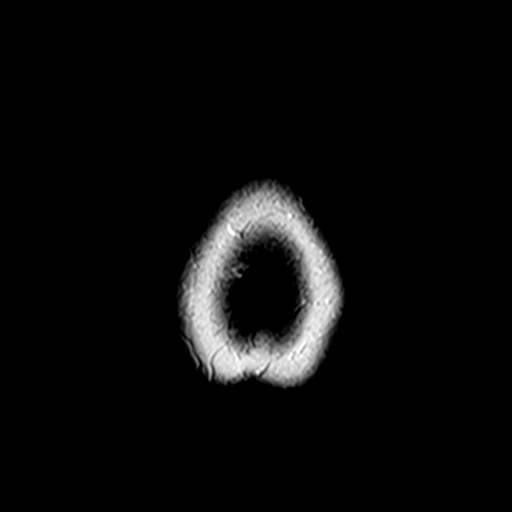

[Series 9: swi_images · axial · 4.0mm · 0.94mm/px · z∈[-87,+53]mm · 4 of 36 slices shown]
[im 1/36]
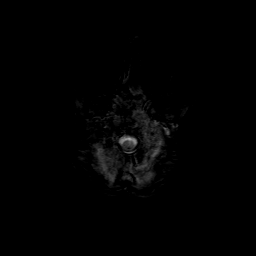
[im 12/36]
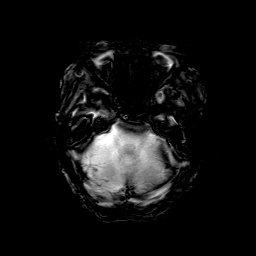
[im 24/36]
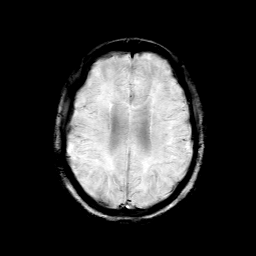
[im 36/36]
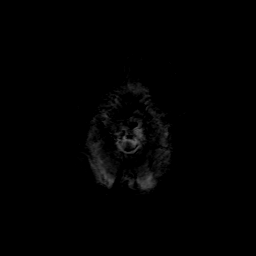

[Series 10: T1 · sagittal · 3.0mm · 0.33mm/px · 1 of 11 slices shown (2 of 3)]
[im 1/11]
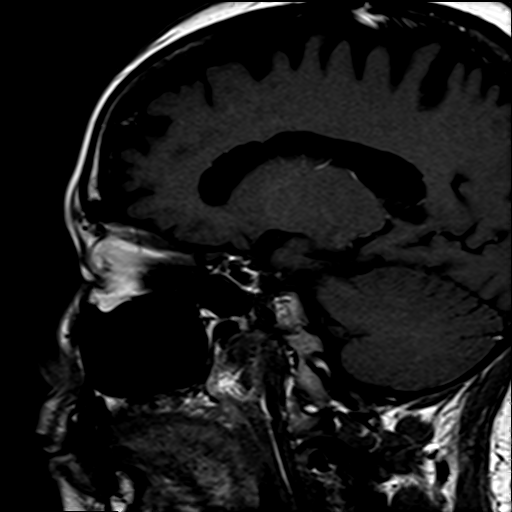

[Series 11: T1 · coronal · 3.0mm · 0.33mm/px · 1 of 11 slices shown (3 of 3)]
[im 1/11]
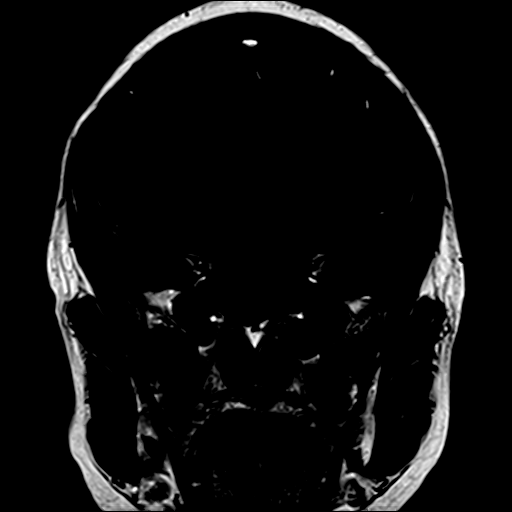

[Series 12: pre cor dynamic · coronal · non-contrast · 3.0mm · 0.35mm/px · 1 of 7 slices shown]
[im 1/7]
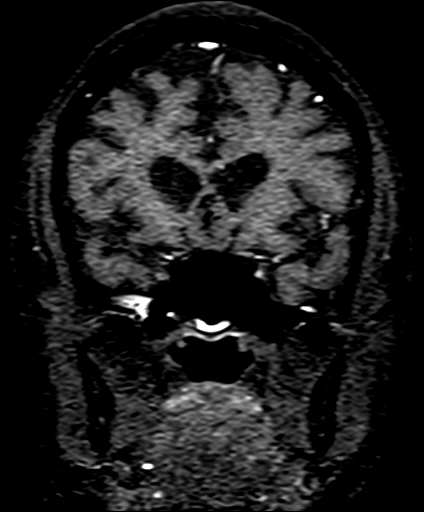

[Series 13: post fs cor · coronal · 3.0mm · 0.35mm/px · 1 of 7 slices shown]
[im 1/7]
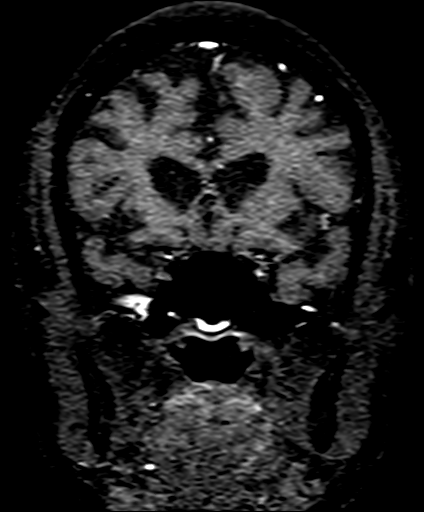

[Series 19: T1 post-contrast · sagittal · 3.0mm · 0.33mm/px · 1 of 11 slices shown (1 of 3)]
[im 1/11]
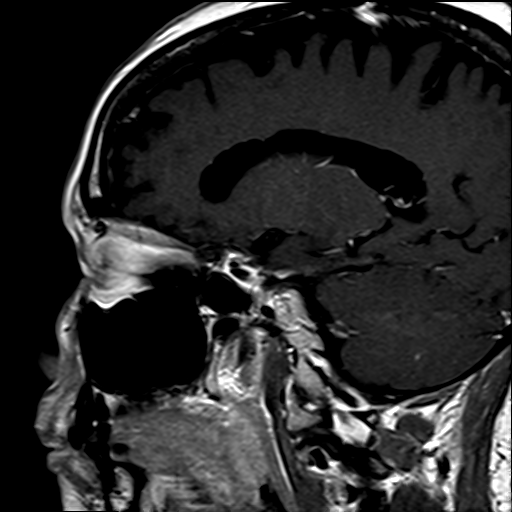

[Series 20: T1 post-contrast · coronal · 3.0mm · 0.33mm/px · 1 of 11 slices shown (2 of 3)]
[im 1/11]
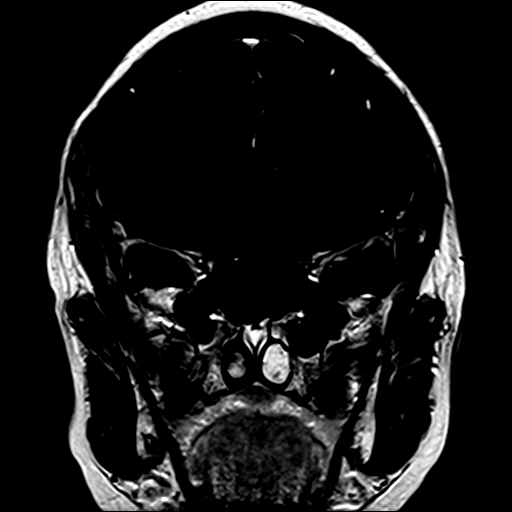

[Series 22: T1 post-contrast · coronal · 5.0mm · 0.45mm/px · 3 of 27 slices shown (3 of 3)]
[im 1/27]
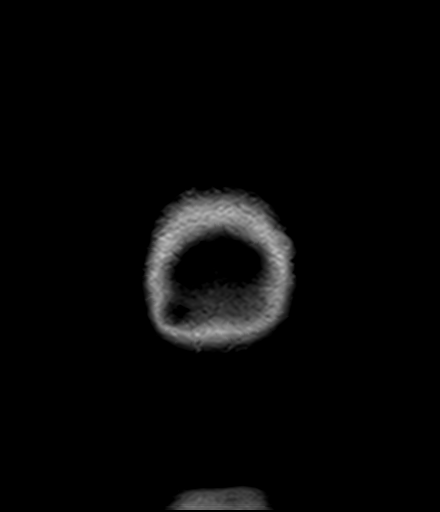
[im 14/27]
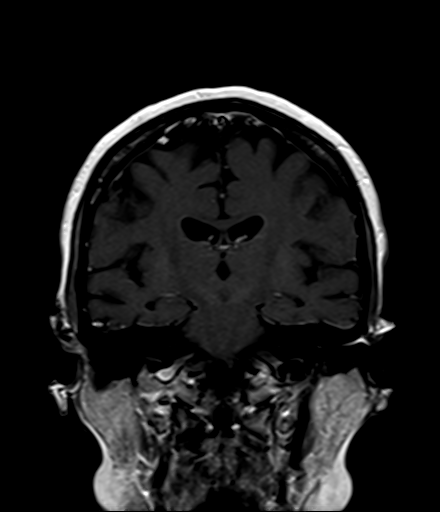
[im 27/27]
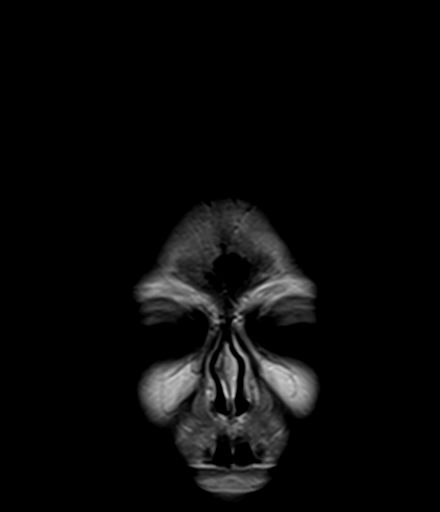

[36 of 48 positions shown; findings below may reference images not displayed]

FINDINGS: Brain: Pituitary protocol performed. Hyperdense cyst in the
posterior pituitary measuring approximately 6 mm in height. This is
displacing the pituitary anteriorly and superiorly. The enhancing
pituitary measures 8.7 mm in height which is mildly enlarged.
Negative for microadenoma. Convex superior border of the pituitary
touching the chiasm but not causing chiasm compression. Normal
cavernous sinus bilaterally.

Generalized atrophy, moderate to advanced for age. Negative for
hydrocephalus. Negative for hemorrhage or mass. Mild white matter
changes with scattered small deep white matter hyperintensities
bilaterally. Normal enhancement of the brain.

Vascular: Normal arterial flow voids.

Skull and upper cervical spine: No focal skeletal lesion.

Sinuses/Orbits: Paranasal sinuses clear.  Negative orbit

Other: None
IMPRESSION: No acute intracranial abnormality. Generalized atrophy is present
similar to the prior study

6 mm hyperintense cyst in the posterior pituitary most likely
Rathke's cleft cyst. This is displacing the pituitary anteriorly and
superiorly. The anterior pituitary measures 8.7 mm in height which
is mildly enlarged for age. Correlate with pituitary function test.
Pituitary is touching the chiasm without compression the chiasm. No
pituitary microadenoma.

## 2021-04-19 MED ORDER — GADOBENATE DIMEGLUMINE 529 MG/ML IV SOLN
7.0000 mL | Freq: Once | INTRAVENOUS | Status: AC | PRN
  Administered 2021-04-19: 09:00:00 7 mL via INTRAVENOUS

## 2021-04-21 ENCOUNTER — Other Ambulatory Visit: Payer: Self-pay

## 2021-04-21 ENCOUNTER — Telehealth: Payer: Self-pay | Admitting: Physician Assistant

## 2021-04-21 DIAGNOSIS — G300 Alzheimer's disease with early onset: Secondary | ICD-10-CM

## 2021-04-21 NOTE — Telephone Encounter (Signed)
Patients husband is returning a call to christy.

## 2021-04-21 NOTE — Progress Notes (Signed)
Left message for patient to return my call.

## 2021-04-21 NOTE — Telephone Encounter (Signed)
Huntley Dec spoke to the patient husband to explain pt needs labs drawn before her visit next week.  Please stop by the office to fill out DPR form as well as if he has a POA paperwork bring that  as well, or have one drawn up since the patient having some Cognitive issues per Huntley Dec, [place]  This will help [place] next time the office or as other providers can speak to him.

## 2021-04-22 ENCOUNTER — Other Ambulatory Visit: Payer: Federal, State, Local not specified - PPO

## 2021-04-22 DIAGNOSIS — F02C0 Dementia in other diseases classified elsewhere, severe, without behavioral disturbance, psychotic disturbance, mood disturbance, and anxiety: Secondary | ICD-10-CM | POA: Diagnosis not present

## 2021-04-22 DIAGNOSIS — G300 Alzheimer's disease with early onset: Secondary | ICD-10-CM

## 2021-04-29 LAB — SPECIMEN STATUS REPORT

## 2021-04-29 LAB — ADH: ADH: <0.8 pg/mL (ref 0.0–4.7)

## 2021-05-02 LAB — FSH/LH
FSH: 15.6 m[IU]/mL
LH: 13.1 m[IU]/mL

## 2021-05-02 LAB — THYROID PANEL WITH TSH
Free Thyroxine Index: 2.2 (ref 1.4–3.8)
T3 Uptake: 30 % (ref 22–35)
T4, Total: 7.2 ug/dL (ref 5.1–11.9)
TSH: 1.28 m[IU]/L

## 2021-05-02 LAB — EXTRA SPECIMEN

## 2021-05-02 LAB — GROWTH HORMONE: Growth Hormone: 0.3 ng/mL (ref <=7.1)

## 2021-05-02 LAB — INSULIN-LIKE GROWTH FACTOR
IGF-I, LC/MS: 106 ng/mL (ref 50–317)
Z-Score (Female): -0.6 {STDV}

## 2021-05-02 LAB — PROLACTIN: Prolactin: 13.8 ng/mL

## 2021-05-02 LAB — ACTH: C206 ACTH: 20 pg/mL (ref 6–50)

## 2021-05-10 ENCOUNTER — Encounter: Payer: Self-pay | Admitting: Physician Assistant

## 2021-05-10 ENCOUNTER — Other Ambulatory Visit: Payer: Self-pay

## 2021-05-10 ENCOUNTER — Ambulatory Visit: Payer: Federal, State, Local not specified - PPO | Admitting: Physician Assistant

## 2021-05-10 VITALS — BP 124/86 | HR 76 | Resp 20 | Ht 62.0 in | Wt 148.0 lb

## 2021-05-10 DIAGNOSIS — F02C0 Dementia in other diseases classified elsewhere, severe, without behavioral disturbance, psychotic disturbance, mood disturbance, and anxiety: Secondary | ICD-10-CM | POA: Diagnosis not present

## 2021-05-10 DIAGNOSIS — F0280 Dementia in other diseases classified elsewhere without behavioral disturbance: Secondary | ICD-10-CM

## 2021-05-10 DIAGNOSIS — G300 Alzheimer's disease with early onset: Secondary | ICD-10-CM

## 2021-05-10 NOTE — Progress Notes (Signed)
Assessment/Plan:    Early onset dementia without behavioral disturbance   Continue donepezil 10 mg daily Side effects were discussed Referral to [facility] for therapeutic options in view of his early onset diagnosis of dementia, candidacy of any trials, medications, etc.  Follow up in 6 months  Discussed safety both in and out of the home.  Discussed the importance of regular daily schedule with inclusion of crossword puzzles to maintain brain function.  Continue to monitor mood  Stay active at least 30 minutes at least 3 times a week.  Naps should be scheduled and should be no longer than 60 minutes and should not occur after 2 PM.  Mediterranean diet is recommended   Case discussed with Dr. Karel Jarvis who agrees with the plan     Subjective:   ED visits since last seen: none  Hospital admissions: none  Wendy Sparks is a 51 y.o. female  seen today in follow up for memory loss. This patient is accompanied in the office by her husband who supplements the history.  Previous records as well as any outside records available were reviewed prior to todays visit.  She was last seen on11/29 in follow up. Last MMSE in 11/2020  was 8/30.  MRI brain w and wo contrast 11/2020 revealed parenchymal volume loss greater than expected for age as well as minor chronic microvascular ischemic changes and a prominent pituitary with normal hormonal lab work.  LP on 01/07/2021 was negative for autoimmune, protein, and infectious disease. LP on 04/01/21 showed 14-3-3 gamma CSF positive 6322, T-Tau 285 (H), P-Tau 26.8 (H),  p-Tau/ABeta 42 ration 0.041 (H).  She is currently on donepezil 10 mg daily without side effects.  Husband reports that her memory has not gotten better, perhaps "a little bit better ". She "is more connected", as well as more active. She is more willing to participate in ADLs, workout routine and eat healthier.   Her temperament is more stable, she is less irritable, but  does cry at times when she cannot remember names or words, or dates.  She continues to ask the same questions. She has trouble remembering how to get dressed, or how  to use a remote control,  or to operate the washer and dryer.  She no longer drives.   She continues to do crossword puzzles and word finding as often as she can possibly do.  She sleeps well, denies vivid dreams or sleepwalking.  No hallucinations or paranoia.  No hygiene concerns, she is independent of bathing and dressing.  Her husband is in charge of the medications and finances.  Her appetite is good, denies trouble swallowing, she does not cook.  She ambulates without difficulty, without falls or head injuries.Denies headaches, double vision, dizziness, focal numbness or tingling, unilateral weakness or tremors or anosmia. No history of seizures. Denies urine incontinence, retention, constipation or diarrhea.    MRI brain w and wo contrast 11/2020 Parenchymal volume loss greater than expected for age. Minor chronic microvascular ischemic changes. Prominent pituitary  with repeat MRI negative for pituitary structural abnormalities.   LP 01/07/21 neg for autoimmune and infectious disease   Labs on 12/09/2020 TSH 1.03 Vitamin D 25 OH 24.3 (low) Vitamin B12 was 764 Folic acid greater than 20 normal Chemistries normal CBC normal ACTH, ADH, FSH, GH, IL GF, Prolactin, normal  PREVIOUS MEDICATIONS:   CURRENT MEDICATIONS:  Outpatient Encounter Medications as of 05/10/2021  Medication Sig   aspirin-acetaminophen-caffeine (EXCEDRIN MIGRAINE) 250-250-65 MG tablet  Take by mouth every 6 (six) hours as needed for headache.   atorvastatin (LIPITOR) 10 MG tablet Take 10 mg by mouth daily.   atorvastatin (LIPITOR) 10 MG tablet Take 1 tablet by mouth daily.   cyclobenzaprine (FLEXERIL) 5 MG tablet TAKE 1 TABLET BY MOUTH 2 TIMES A DAY AS NEEDED, LIMIT TO 2 DAYS PER WEEK   donepezil (ARICEPT) 10 MG tablet Take half tablet (5 mg) daily for 2 weeks,  then increase to the full tablet at 10 mg daily   Multiple Vitamin (MULTI VITAMIN) TABS 1 tablet   No facility-administered encounter medications on file as of 05/10/2021.     Objective:     PHYSICAL EXAMINATION:    VITALS:   Vitals:   05/10/21 0758  BP: 124/86  Pulse: 76  Resp: 20  SpO2: 97%  Weight: 148 lb (67.1 kg)  Height: 5\' 2"  (1.575 m)     GEN:  The patient appears stated age and is in NAD. HEENT:  Normocephalic, atraumatic.   Neurological examination:  General: NAD, well-groomed, appears stated age. Orientation: The patient is alert. Oriented to person, not to place and time. Cranial nerves: There is good facial symmetry.The speech is fluent and clear. No aphasia or dysarthria. Fund of knowledge is reduced. Recent and remote memory are impaired. Attention and concentration are reduced.  Able to name objects and repeat phrases.  Hearing is intact to conversational tone.    Sensation: Sensation is intact to light touch throughout Motor: Strength is at least antigravity x4. Tremors: none  DTR's 2/4 in UE/LE     Montreal Cognitive Assessment  12/27/2020  Visuospatial/ Executive (0/5) 0  Naming (0/3) 3  Attention: Read list of digits (0/2) 1  Attention: Read list of letters (0/1) 0  Attention: Serial 7 subtraction starting at 100 (0/3) 0  Language: Repeat phrase (0/2) 0  Language : Fluency (0/1) 0  Abstraction (0/2) 2  Delayed Recall (0/5) 0  Orientation (0/6) 2  Total 8  Adjusted Score (based on education) 8   MMSE - Mini Mental State Exam 05/10/2021  Orientation to time 2  Orientation to Place 2  Registration 3  Attention/ Calculation 0  Recall 1  Language- name 2 objects 2  Language- repeat 1  Language- follow 3 step command 3  Language- read & follow direction 1  Write a sentence 0  Copy design 0  Total score 15    No flowsheet data found.     Movement examination: Tone: There is normal tone in the UE/LE Abnormal movements:  no tremor.  No  myoclonus.  No asterixis.   Coordination:  There is no decremation with RAM's. Normal finger to nose  Gait and Station: The patient has no difficulty arising out of a deep-seated chair without the use of the hands. The patient's stride length is good.  Gait is cautious and narrow.        Total time spent on today's visit was 60 minutes, including both face-to-face time and nonface-to-face time. Time included that spent on review of records (prior notes available to me/labs/imaging if pertinent), discussing treatment and goals, answering patient's questions and coordinating care.  Cc:  [place], [place], [place], PA-C

## 2021-05-10 NOTE — Patient Instructions (Signed)
It was a pleasure to see you today at our office.   Recommendations:  Meds: Follow up in 6  months [facility]erral to [facility]    RECOMMENDATIONS FOR ALL PATIENTS WITH MEMORY PROBLEMS: 1. Continue to exercise (Recommend 30 minutes of walking everyday, or 3 hours every week) 2. Increase social interactions - continue going to [place] and enjoy social gatherings with friends and family 3. Eat healthy, avoid fried foods and eat more fruits and vegetables 4. Maintain adequate blood pressure, blood sugar, and blood cholesterol level. Reducing the risk of stroke and cardiovascular disease also helps promoting better memory. 5. Avoid stressful situations. Live a simple life and avoid aggravations. Organize your time and prepare for the next day in anticipation. 6. Sleep well, avoid any interruptions of sleep and avoid any distractions in the bedroom that may interfere with adequate sleep quality 7. Avoid sugar, avoid sweets as there is a strong link between excessive sugar intake, diabetes, and cognitive impairment We discussed the Mediterranean diet, which has been shown to help patients reduce the risk of progressive memory disorders and reduces cardiovascular risk. This includes eating fish, eat fruits and green leafy vegetables, nuts like almonds and hazelnuts, walnuts, and also use olive oil. Avoid fast foods and fried foods as much as possible. Avoid sweets and sugar as sugar use has been linked to worsening of memory function.  There is always a concern of gradual progression of memory problems. If this is the case, then we may need to adjust level of care according to patient needs. Support, both to the patient and caregiver, should then be put into place.    The Alzheimers Association is here all day, every day for people facing Alzheimers disease through our free 24/7 Helpline: [phone]. The Helpline provides reliable information and support to all those who need assistance,  such as individuals living with memory loss, Alzheimer's or other dementia, caregivers, health care professionals and the public.  Our highly trained and knowledgeable staff can help you with: Understanding memory loss, dementia and Alzheimer's  Medications and other treatment options  General information about aging and brain health  Skills to provide quality care and to find the best care from professionals  Legal, financial and living-arrangement decisions Our Helpline also features: Confidential care consultation provided by master's level clinicians who can help with decision-making support, crisis assistance and education on issues families face every day  Help in a caller's preferred language using our translation service that features more than 200 languages and dialects  Referrals to local community programs, services and ongoing support     FALL PRECAUTIONS: Be cautious when walking. Scan the area for obstacles that may increase the risk of trips and falls. When getting up in the mornings, sit up at the edge of the bed for a few minutes before getting out of bed. Consider elevating the bed at the head end to avoid drop of blood pressure when getting up. Walk always in a well-lit room (use night lights in the walls). Avoid area rugs or power cords from appliances in the middle of the walkways. Use a walker or a cane if necessary and consider physical therapy for balance exercise. Get your eyesight checked regularly.  FINANCIAL OVERSIGHT: Supervision, especially oversight when making financial decisions or transactions is also recommended.  HOME SAFETY: Consider the safety of the kitchen when operating appliances like stoves, microwave oven, and blender. Consider having supervision and share cooking responsibilities until no longer able to participate  in those. Accidents with firearms and other hazards in the house should be identified and addressed as well.   ABILITY TO BE LEFT ALONE:  If patient is unable to contact 911 operator, consider using LifeLine, or when the need is there, arrange for someone to stay with patients. Smoking is a fire hazard, consider supervision or cessation. Risk of wandering should be assessed by caregiver and if detected at any point, supervision and safe proof recommendations should be instituted.  MEDICATION SUPERVISION: Inability to self-administer medication needs to be constantly addressed. Implement a mechanism to ensure safe administration of the medications.   DRIVING: Regarding driving, in patients with progressive memory problems, driving will be impaired. We advise to have someone else do the driving if trouble finding directions or if minor accidents are reported. Independent driving assessment is available to determine safety of driving.   If you are interested in the driving assessment, you can contact the following:  [employer] in [place] [phone]  [facility] [phone]  [facility] [phone] or [phone]      Mediterranean Diet A Mediterranean diet refers to food and lifestyle choices that are based on the traditions of countries located on the [employer]. This way of eating has been shown to help prevent certain conditions and improve outcomes for people who have chronic diseases, like kidney disease and heart disease. What are tips for following this plan? Lifestyle  Cook and eat meals together with your family, when possible. Drink enough fluid to keep your urine clear or pale yellow. Be physically active every day. This includes: Aerobic exercise like running or swimming. Leisure activities like gardening, walking, or housework. Get 7-8 hours of sleep each night. If recommended by your health care provider, drink red wine in moderation. This means 1 glass a day for nonpregnant women and 2 glasses a day for men. A glass of  wine equals 5 oz (150 mL). Reading food labels  Check the serving size of packaged foods. For foods such as rice and pasta, the serving size refers to the amount of cooked product, not dry. Check the total fat in packaged foods. Avoid foods that have saturated fat or trans fats. Check the ingredients list for added sugars, such as corn syrup. Shopping  At the grocery store, buy most of your food from the areas near the walls of the store. This includes: Fresh fruits and vegetables (produce). Grains, beans, nuts, and seeds. Some of these may be available in unpackaged forms or large amounts (in bulk). Fresh seafood. Poultry and eggs. Low-fat dairy products. Buy whole ingredients instead of prepackaged foods. Buy fresh fruits and vegetables in-season from local farmers markets. Buy frozen fruits and vegetables in resealable bags. If you do not have access to quality fresh seafood, buy precooked frozen shrimp or canned fish, such as tuna, salmon, or sardines. Buy small amounts of raw or cooked vegetables, salads, or olives from the deli or salad bar at your store. Stock your pantry so you always have certain foods on hand, such as olive oil, canned tuna, canned tomatoes, rice, pasta, and beans. Cooking  Cook foods with extra-virgin olive oil instead of using butter or other vegetable oils. Have meat as a side dish, and have vegetables or grains as your main dish. This means having meat in small portions or adding small amounts of meat to foods like pasta or stew. Use beans or vegetables instead of meat in common dishes like  chili or lasagna. Experiment with different cooking methods. Try roasting or broiling vegetables instead of steaming or sauteing them. Add frozen vegetables to soups, stews, pasta, or rice. Add nuts or seeds for added healthy fat at each meal. You can add these to yogurt, salads, or vegetable dishes. Marinate fish or vegetables using olive oil, lemon juice, garlic, and  fresh herbs. Meal planning  Plan to eat 1 vegetarian meal one day each week. Try to work up to 2 vegetarian meals, if possible. Eat seafood 2 or more times a week. Have healthy snacks readily available, such as: Vegetable sticks with hummus. Greek yogurt. Fruit and nut trail mix. Eat balanced meals throughout the week. This includes: Fruit: 2-3 servings a day Vegetables: 4-5 servings a day Low-fat dairy: 2 servings a day Fish, poultry, or lean meat: 1 serving a day Beans and legumes: 2 or more servings a week Nuts and seeds: 1-2 servings a day Whole grains: 6-8 servings a day Extra-virgin olive oil: 3-4 servings a day Limit red meat and sweets to only a few servings a month What are my food choices? Mediterranean diet Recommended Grains: Whole-grain pasta. Brown rice. Bulgar wheat. Polenta. Couscous. Whole-wheat bread. Orpah Cobb. Vegetables: Artichokes. Beets. Broccoli. Cabbage. Carrots. Eggplant. Green beans. Chard. Kale. Spinach. Onions. Leeks. Peas. Squash. Tomatoes. Peppers. Radishes. Fruits: Apples. Apricots. Avocado. Berries. Bananas. Cherries. Dates. Figs. Grapes. Lemons. Melon. Oranges. Peaches. Plums. Pomegranate. Meats and other protein foods: Beans. Almonds. Sunflower seeds. Pine nuts. Peanuts. Cod. Salmon. Scallops. Shrimp. Tuna. Tilapia. Clams. Oysters. Eggs. Dairy: Low-fat milk. Cheese. Greek yogurt. Beverages: Water. Red wine. Herbal tea. Fats and oils: Extra virgin olive oil. Avocado oil. Grape seed oil. Sweets and desserts: [place] yogurt with honey. Baked apples. Poached pears. Trail mix. Seasoning and other foods: Basil. Cilantro. Coriander. Cumin. Mint. Parsley. Sage. Rosemary. Tarragon. Garlic. Oregano. Thyme. Pepper. Balsalmic vinegar. Tahini. Hummus. Tomato sauce. Olives. Mushrooms. Limit these Grains: Prepackaged pasta or rice dishes. Prepackaged cereal with added sugar. Vegetables: Deep fried potatoes (french fries). Fruits: Fruit canned in syrup. Meats  and other protein foods: Beef. Pork. Lamb. Poultry with skin. Hot dogs. Tomasa Blase. Dairy: Ice cream. Sour cream. Whole milk. Beverages: Juice. Sugar-sweetened soft drinks. Beer. Liquor and spirits. Fats and oils: Butter. Canola oil. Vegetable oil. Beef fat (tallow). Lard. Sweets and desserts: Cookies. Cakes. Pies. Candy. Seasoning and other foods: Mayonnaise. Premade sauces and marinades. The items listed may not be a complete list. Talk with your dietitian about what dietary choices are right for you. Summary The Mediterranean diet includes both food and lifestyle choices. Eat a variety of fresh fruits and vegetables, beans, nuts, seeds, and whole grains. Limit the amount of red meat and sweets that you eat. Talk with your health care provider about whether it is safe for you to drink red wine in moderation. This means 1 glass a day for nonpregnant women and 2 glasses a day for men. A glass of wine equals 5 oz (150 mL). This information is not intended to replace advice given to you by your health care provider. Make sure you discuss any questions you have with your health care provider. Document Released: 12/09/2015 Document Revised: 01/11/2016 Document Reviewed: 12/09/2015 Elsevier Interactive Patient Education  2017 [employer].

## 2021-06-01 DIAGNOSIS — K5904 Chronic idiopathic constipation: Secondary | ICD-10-CM | POA: Diagnosis not present

## 2021-06-01 DIAGNOSIS — Z0000 Encounter for general adult medical examination without abnormal findings: Secondary | ICD-10-CM | POA: Diagnosis not present

## 2021-06-01 DIAGNOSIS — E7800 Pure hypercholesterolemia, unspecified: Secondary | ICD-10-CM | POA: Diagnosis not present

## 2021-06-01 DIAGNOSIS — G300 Alzheimer's disease with early onset: Secondary | ICD-10-CM | POA: Diagnosis not present

## 2021-07-27 ENCOUNTER — Other Ambulatory Visit: Payer: Self-pay | Admitting: Nurse Practitioner

## 2021-07-27 ENCOUNTER — Other Ambulatory Visit (HOSPITAL_COMMUNITY)
Admission: RE | Admit: 2021-07-27 | Discharge: 2021-07-27 | Disposition: A | Discharge status: Home / Self Care | Payer: Federal, State, Local not specified - PPO | Source: Ambulatory Visit | Attending: Nurse Practitioner | Admitting: Nurse Practitioner

## 2021-07-27 DIAGNOSIS — Z124 Encounter for screening for malignant neoplasm of cervix: Secondary | ICD-10-CM | POA: Insufficient documentation

## 2021-07-27 DIAGNOSIS — Z01419 Encounter for gynecological examination (general) (routine) without abnormal findings: Secondary | ICD-10-CM | POA: Diagnosis not present

## 2021-07-29 LAB — CYTOLOGY - PAP
Comment: NEGATIVE
Diagnosis: NEGATIVE
High risk HPV: NEGATIVE

## 2021-11-07 ENCOUNTER — Ambulatory Visit: Payer: Federal, State, Local not specified - PPO | Admitting: Physician Assistant

## 2021-11-07 ENCOUNTER — Encounter: Payer: Self-pay | Admitting: Physician Assistant

## 2021-11-07 VITALS — BP 123/77 | HR 78 | Resp 18 | Ht 62.0 in | Wt 142.0 lb

## 2021-11-07 DIAGNOSIS — F02C0 Dementia in other diseases classified elsewhere, severe, without behavioral disturbance, psychotic disturbance, mood disturbance, and anxiety: Secondary | ICD-10-CM

## 2021-11-07 DIAGNOSIS — G300 Alzheimer's disease with early onset: Secondary | ICD-10-CM | POA: Diagnosis not present

## 2021-11-07 MED ORDER — ESCITALOPRAM OXALATE 5 MG PO TABS: ORAL_TABLET | ORAL | 1 refills | Status: DC

## 2021-11-07 MED ORDER — MEMANTINE HCL 10 MG PO TABS: ORAL_TABLET | ORAL | 11 refills | Status: DC

## 2021-11-07 NOTE — Patient Instructions (Signed)
It was a pleasure to see you today at our office.   Recommendations:  Follow up in   months Continue donepezil 10 mg daily. Side effects were discussed  Start  Memantine 10 mg   Take 1 tablet (10 mg at night) for 2 weeks, then increase to 1 tablet (10 mg) twice a day.    Start Lexapro 5 mg daily for mood Will re-refer to [facility] and will place referral to [facility]   Whom to call:  Memory  decline, memory medications: Call our office [phone]   For psychiatric meds, mood meds: Please have your primary care physician manage these medications.   Counseling regarding caregiver distress, including caregiver depression, anxiety and issues regarding community resources, adult day care programs, adult living facilities, or memory care questions:   Feel free to contact Misty Lisabeth Register, Social Worker at [phone]   For assessment of decision of mental capacity and competency:  Call Dr. Erick Blinks, geriatric psychiatrist at [phone]  For guidance in geriatric dementia issues please call [facility] [phone]  For guidance regarding [facility] and if placement were needed at the facility, contact Sidney Ace, Social Worker tel: [phone]  If you have any severe symptoms of a stroke, or other severe issues such as confusion,severe chills or fever, etc call 911 or go to the ER as you may need to be evaluated further   Feel free to visit Facebook page " [employer]" for tips of how to care for people with memory problems.   Feel free to go to the following database for funded clinical studies conducted around the world: RankChecks.se   https://www.triadclinicaltrials.com/     RECOMMENDATIONS FOR ALL PATIENTS WITH MEMORY PROBLEMS: 1. Continue to exercise (Recommend 30 minutes of walking everyday, or 3 hours every week) 2. Increase social interactions - continue going to [place] and enjoy social gatherings with friends and  family 3. Eat healthy, avoid fried foods and eat more fruits and vegetables 4. Maintain adequate blood pressure, blood sugar, and blood cholesterol level. Reducing the risk of stroke and cardiovascular disease also helps promoting better memory. 5. Avoid stressful situations. Live a simple life and avoid aggravations. Organize your time and prepare for the next day in anticipation. 6. Sleep well, avoid any interruptions of sleep and avoid any distractions in the bedroom that may interfere with adequate sleep quality 7. Avoid sugar, avoid sweets as there is a strong link between excessive sugar intake, diabetes, and cognitive impairment We discussed the Mediterranean diet, which has been shown to help patients reduce the risk of progressive memory disorders and reduces cardiovascular risk. This includes eating fish, eat fruits and green leafy vegetables, nuts like almonds and hazelnuts, walnuts, and also use olive oil. Avoid fast foods and fried foods as much as possible. Avoid sweets and sugar as sugar use has been linked to worsening of memory function.  There is always a concern of gradual progression of memory problems. If this is the case, then we may need to adjust level of care according to patient needs. Support, both to the patient and caregiver, should then be put into place.    The Alzheimer's Association is here all day, every day for people facing Alzheimer's disease through our free 24/7 Helpline: [phone]. The Helpline provides reliable information and support to all those who need assistance, such as individuals living with memory loss, Alzheimer's or other dementia, caregivers, health care professionals and the public.  Our highly trained and knowledgeable  staff can help you with: Understanding memory loss, dementia and Alzheimer's  Medications and other treatment options  General information about aging and brain health  Skills to provide quality care and to find the best care  from professionals  Legal, financial and living-arrangement decisions Our Helpline also features: Confidential care consultation provided by master's level clinicians who can help with decision-making support, crisis assistance and education on issues families face every day  Help in a caller's preferred language using our translation service that features more than 200 languages and dialects  Referrals to local community programs, services and ongoing support     FALL PRECAUTIONS: Be cautious when walking. Scan the area for obstacles that may increase the risk of trips and falls. When getting up in the mornings, sit up at the edge of the bed for a few minutes before getting out of bed. Consider elevating the bed at the head end to avoid drop of blood pressure when getting up. Walk always in a well-lit room (use night lights in the walls). Avoid area rugs or power cords from appliances in the middle of the walkways. Use a walker or a cane if necessary and consider physical therapy for balance exercise. Get your eyesight checked regularly.  FINANCIAL OVERSIGHT: Supervision, especially oversight when making financial decisions or transactions is also recommended.  HOME SAFETY: Consider the safety of the kitchen when operating appliances like stoves, microwave oven, and blender. Consider having supervision and share cooking responsibilities until no longer able to participate in those. Accidents with firearms and other hazards in the house should be identified and addressed as well.   ABILITY TO BE LEFT ALONE: If patient is unable to contact 911 operator, consider using LifeLine, or when the need is there, arrange for someone to stay with patients. Smoking is a fire hazard, consider supervision or cessation. Risk of wandering should be assessed by caregiver and if detected at any point, supervision and safe proof recommendations should be instituted.  MEDICATION SUPERVISION: Inability to  self-administer medication needs to be constantly addressed. Implement a mechanism to ensure safe administration of the medications.   DRIVING: Regarding driving, in patients with progressive memory problems, driving will be impaired. We advise to have someone else do the driving if trouble finding directions or if minor accidents are reported. Independent driving assessment is available to determine safety of driving.   If you are interested in the driving assessment, you can contact the following:  [employer] in [place] [phone]  [facility] [phone]  [facility] [phone] or [phone]      Mediterranean Diet A Mediterranean diet refers to food and lifestyle choices that are based on the traditions of countries located on the [employer]. This way of eating has been shown to help prevent certain conditions and improve outcomes for people who have chronic diseases, like kidney disease and heart disease. What are tips for following this plan? Lifestyle  Cook and eat meals together with your family, when possible. Drink enough fluid to keep your urine clear or pale yellow. Be physically active every day. This includes: Aerobic exercise like running or swimming. Leisure activities like gardening, walking, or housework. Get 7-8 hours of sleep each night. If recommended by your health care provider, drink red wine in moderation. This means 1 glass a day for nonpregnant women and 2 glasses a day for men. A glass of wine equals 5 oz (150 mL). Reading food labels  Check  the serving size of packaged foods. For foods such as rice and pasta, the serving size refers to the amount of cooked product, not dry. Check the total fat in packaged foods. Avoid foods that have saturated fat or trans fats. Check the ingredients list for added sugars, such as corn syrup. Shopping  At the grocery store, buy  most of your food from the areas near the walls of the store. This includes: Fresh fruits and vegetables (produce). Grains, beans, nuts, and seeds. Some of these may be available in unpackaged forms or large amounts (in bulk). Fresh seafood. Poultry and eggs. Low-fat dairy products. Buy whole ingredients instead of prepackaged foods. Buy fresh fruits and vegetables in-season from local farmers markets. Buy frozen fruits and vegetables in resealable bags. If you do not have access to quality fresh seafood, buy precooked frozen shrimp or canned fish, such as tuna, salmon, or sardines. Buy small amounts of raw or cooked vegetables, salads, or olives from the deli or salad bar at your store. Stock your pantry so you always have certain foods on hand, such as olive oil, canned tuna, canned tomatoes, rice, pasta, and beans. Cooking  Cook foods with extra-virgin olive oil instead of using butter or other vegetable oils. Have meat as a side dish, and have vegetables or grains as your main dish. This means having meat in small portions or adding small amounts of meat to foods like pasta or stew. Use beans or vegetables instead of meat in common dishes like chili or lasagna. Experiment with different cooking methods. Try roasting or broiling vegetables instead of steaming or sauteing them. Add frozen vegetables to soups, stews, pasta, or rice. Add nuts or seeds for added healthy fat at each meal. You can add these to yogurt, salads, or vegetable dishes. Marinate fish or vegetables using olive oil, lemon juice, garlic, and fresh herbs. Meal planning  Plan to eat 1 vegetarian meal one day each week. Try to work up to 2 vegetarian meals, if possible. Eat seafood 2 or more times a week. Have healthy snacks readily available, such as: Vegetable sticks with hummus. Greek yogurt. Fruit and nut trail mix. Eat balanced meals throughout the week. This includes: Fruit: 2-3 servings a day Vegetables: 4-5  servings a day Low-fat dairy: 2 servings a day Fish, poultry, or lean meat: 1 serving a day Beans and legumes: 2 or more servings a week Nuts and seeds: 1-2 servings a day Whole grains: 6-8 servings a day Extra-virgin olive oil: 3-4 servings a day Limit red meat and sweets to only a few servings a month What are my food choices? Mediterranean diet Recommended Grains: Whole-grain pasta. Brown rice. Bulgar wheat. Polenta. Couscous. Whole-wheat bread. Orpah Cobb. Vegetables: Artichokes. Beets. Broccoli. Cabbage. Carrots. Eggplant. Green beans. Chard. Kale. Spinach. Onions. Leeks. Peas. Squash. Tomatoes. Peppers. Radishes. Fruits: Apples. Apricots. Avocado. Berries. Bananas. Cherries. Dates. Figs. Grapes. Lemons. Melon. Oranges. Peaches. Plums. Pomegranate. Meats and other protein foods: Beans. Almonds. Sunflower seeds. Pine nuts. Peanuts. Cod. Salmon. Scallops. Shrimp. Tuna. Tilapia. Clams. Oysters. Eggs. Dairy: Low-fat milk. Cheese. Greek yogurt. Beverages: Water. Red wine. Herbal tea. Fats and oils: Extra virgin olive oil. Avocado oil. Grape seed oil. Sweets and desserts: [place] yogurt with honey. Baked apples. Poached pears. Trail mix. Seasoning and other foods: Basil. Cilantro. Coriander. Cumin. Mint. Parsley. Sage. Rosemary. Tarragon. Garlic. Oregano. Thyme. Pepper. Balsalmic vinegar. Tahini. Hummus. Tomato sauce. Olives. Mushrooms. Limit these Grains: Prepackaged pasta or rice dishes. Prepackaged cereal with added sugar. Vegetables: Deep fried  potatoes (french fries). Fruits: Fruit canned in syrup. Meats and other protein foods: Beef. Pork. Lamb. Poultry with skin. Hot dogs. Tomasa Blase. Dairy: Ice cream. Sour cream. Whole milk. Beverages: Juice. Sugar-sweetened soft drinks. Beer. Liquor and spirits. Fats and oils: Butter. Canola oil. Vegetable oil. Beef fat (tallow). Lard. Sweets and desserts: Cookies. Cakes. Pies. Candy. Seasoning and other foods: Mayonnaise. Premade sauces and  marinades. The items listed may not be a complete list. Talk with your dietitian about what dietary choices are right for you. Summary The Mediterranean diet includes both food and lifestyle choices. Eat a variety of fresh fruits and vegetables, beans, nuts, seeds, and whole grains. Limit the amount of red meat and sweets that you eat. Talk with your health care provider about whether it is safe for you to drink red wine in moderation. This means 1 glass a day for nonpregnant women and 2 glasses a day for men. A glass of wine equals 5 oz (150 mL). This information is not intended to replace advice given to you by your health care provider. Make sure you discuss any questions you have with your health care provider. Document Released: 12/09/2015 Document Revised: 01/11/2016 Document Reviewed: 12/09/2015 Elsevier Interactive Patient Education  2017 [employer].

## 2021-11-07 NOTE — Progress Notes (Signed)
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  Wendy Sparks is a very pleasant 51 y.o. RH female with a history of migraines, hyperlipidemia, anxiety, seen today in follow up for memory loss.  She carries a diagnosis of Alzheimer's disease per LP.  She is on donepezil 10 mg daily.  Initial MoCA was 8/30.  MMSE today is 6/30.  There is apparent worsening of the disease.  The patient initially had been referred to [facility] for further evaluation, potential research, but husband reports that he never heard from them.  They are interested in pursuing that visits with them.    Recommendations:    Continue donepezil 10 mg daily Start memantine 10 mg, take 1 tab p.o. nightly, for 2 weeks, then increase to 1 tab p.o. twice daily Start Lexapro 5 mg daily for mood control Awaiting [facility] memory disorder clinic, patient and husband to call them.  In addition, we will place a referral to [facility] for further evaluation as well. Follow-up in 6 months   Case discussed with Dr. Karel Jarvis who agrees with the plan     Subjective:    This patient is accompanied in the office by her husband who supplements the history.  Previous records as well as any outside records available were reviewed prior to todays visit. Patient was last seen at our office on 05/10/2021.  Last MoCA was 8/30.   Any changes in memory since last visit?  "She has some good and bad days, today's a good day "-'s husband says.  Although connected and being active, and being able to participate in ADLs, she realizes that there is difficulty with memory, and begins to cry.  She continues being unable to remember words or dates or recent conversations.  She is unable to operate the remote control, washer or dryer.  She is able to comprehend though. Patient lives with: Husband repeats oneself? Denies Disoriented when walking into a room?  Patient denies   Leaving objects in unusual places?  Patient  denies   Ambulates  with difficulty?   Patient denies   Recent falls?  Patient denies   Any head injuries?  Patient denies   History of seizures?   Patient denies   Wandering behavior?  Patient denies   Patient drives?   Patient no longer drives  Any mood changes such irritability agitation?  Patient denies   Any history of depression?:  Emotionally "she struggles with it when she realizes that this is happening " Hallucinations?  Patient denies   Paranoia?  Patient denies   Patient reports that he sleeps well without vivid dreams, REM behavior or sleepwalking.  "If she is restless I pull her close to me ".   History of sleep apnea?  Patient denies   Any hygiene concerns?  Patient denies   Independent of bathing and dressing?  Endorsed  Does the patient needs help with medications?  Endorsed. Husband in charge  Who is in charge of the finances?   Husband is in charge    Any changes in appetite?  Patient denies   Patient have trouble swallowing? Patient denies   Does the patient cook?  Patient denies   Any kitchen accidents such as leaving the stove on? Patient denies   Any headaches?  Patient denies   Double vision? Patient denies   Any focal numbness or tingling?  Patient denies   Chronic back pain Patient denies   Unilateral weakness?  Patient denies  Any tremors?  Patient denies   Any history of anosmia?  Patient denies   Any incontinence of urine?  Patient denies   Any bowel dysfunction?   Patient denies       Previous note 05/10/2021 Wendy Sparks is a 51 y.o. female  seen today in follow up for memory loss. This patient is accompanied in the office by her husband who supplements the history.  Previous records as well as any outside records available were reviewed prior to todays visit.  She was last seen on11/29 in follow up. Last MMSE in 11/2020  was 8/30.  MRI brain w and wo contrast 11/2020 revealed parenchymal volume loss greater than expected for age as well as minor  chronic microvascular ischemic changes and a prominent pituitary with normal hormonal lab work.  LP on 01/07/2021 was negative for autoimmune, protein, and infectious disease. LP on 04/01/21 showed 14-3-3 gamma CSF positive 6322, T-Tau 285 (H), P-Tau 26.8 (H),  p-Tau/ABeta 42 ration 0.041 (H).  She is currently on donepezil 10 mg daily without side effects.  Husband reports that her memory has not gotten better, perhaps "a little bit better ". She "is more connected", as well as more active. She is more willing to participate in ADLs, workout routine and eat healthier.   Her temperament is more stable, she is less irritable, but does cry at times when she cannot remember names or words, or dates.  She continues to ask the same questions. She has trouble remembering how to get dressed, or how  to use a remote control,  or to operate the washer and dryer.  She no longer drives.   She continues to do crossword puzzles and word finding as often as she can possibly do.  She sleeps well, denies vivid dreams or sleepwalking.  No hallucinations or paranoia.  No hygiene concerns, she is independent of bathing and dressing.  Her husband is in charge of the medications and finances.  Her appetite is good, denies trouble swallowing, she does not cook.  She ambulates without difficulty, without falls or head injuries.Denies headaches, double vision, dizziness, focal numbness or tingling, unilateral weakness or tremors or anosmia. No history of seizures. Denies urine incontinence, retention, constipation or diarrhea.   Initial visit 12/27/2020 The patient is seen in neurologic consultation at the request of Redmon, Noelle, PA for the evaluation of memory.  The patient is accompanied by husband who supplements the history. She is a 51 year old RH woman who had memory difficulties for the last 3 years. Her husband reported that after having to spend one year in [place] for work, and now working from home, was able to notice  significant cognitive decline in the patient, "a big difference in the way she thinks and performs tasks". "Her eyes are glassier than before, I can tell when she has a bad day". Her long term memory is better than the short term-husband says. She has trouble recalling simple words names, recent conversations, and lately, she has forgotten how to set the dinner table,  and has been finding things in different drawers than the ones they should go. She has also does not remember how to use some home appliances such as the washing machine. She stopped driving because she has gotten lost thee times despite having the GPS, which frustrated her. She no longer cooks on her own, they do it together, because she does not remember common recipes. Her appetite is good and denies trouble swallowing. He  denies her leaving the stove on. He husband does the bills and now, he administers her meds. She is independent of dressing and bathing. Ambulates without difficulty without walker or cane. Denies any falls or prior concussions. Denies headaches, double vision, dizziness, focal numbness or tingling, unilateral weakness or tremors. Denies urine incontinence or retention, constipation or diarrhea. Denies anosmia. Denies history of OSA, ETOH or tobacco. Tobacco. Family History remarkable for mother who had Alzheimer's and passed in her 34s     Labs on 12/09/2020 TSH 1.03 Vitamin D 25 OH 24.3 (low) Vitamin B12 was 764 Folic acid greater than 20 normal Chemistries normal CBC normal   MRI of the brain 12/20/2020 Parenchymal volume loss greater than expected for age. Minor chronic microvascular ischemic changes. Prominent pituitary for which dedicated MRI of the sella is recommended.     PREVIOUS MEDICATIONS:   CURRENT MEDICATIONS:  Outpatient Encounter Medications as of 11/07/2021  Medication Sig   aspirin-acetaminophen-caffeine (EXCEDRIN MIGRAINE) 250-250-65 MG tablet Take by mouth every 6 (six) hours as needed for  headache.   atorvastatin (LIPITOR) 10 MG tablet Take 10 mg by mouth daily.   atorvastatin (LIPITOR) 10 MG tablet Take 1 tablet by mouth daily.   cyclobenzaprine (FLEXERIL) 5 MG tablet TAKE 1 TABLET BY MOUTH 2 TIMES A DAY AS NEEDED, LIMIT TO 2 DAYS PER WEEK   donepezil (ARICEPT) 10 MG tablet Take half tablet (5 mg) daily for 2 weeks, then increase to the full tablet at 10 mg daily   escitalopram (LEXAPRO) 5 MG tablet Take 1 tab daily   memantine (NAMENDA) 10 MG tablet Take 1 tablet (10 mg at night) for 2 weeks, then increase to 1 tablet (10 mg) twice a day   Multiple Vitamin (MULTI VITAMIN) TABS 1 tablet   No facility-administered encounter medications on file as of 11/07/2021.       05/10/2021    9:00 AM  MMSE - Mini Mental State Exam  Orientation to time 2  Orientation to Place 2  Registration 3  Attention/ Calculation 0  Recall 1  Language- name 2 objects 2  Language- repeat 1  Language- follow 3 step command 3  Language- read & follow direction 1  Write a sentence 0  Copy design 0  Total score 15      12/27/2020    3:00 PM  Montreal Cognitive Assessment   Visuospatial/ Executive (0/5) 0  Naming (0/3) 3  Attention: Read list of digits (0/2) 1  Attention: Read list of letters (0/1) 0  Attention: Serial 7 subtraction starting at 100 (0/3) 0  Language: Repeat phrase (0/2) 0  Language : Fluency (0/1) 0  Abstraction (0/2) 2  Delayed Recall (0/5) 0  Orientation (0/6) 2  Total 8  Adjusted Score (based on education) 8    Objective:     PHYSICAL EXAMINATION:    VITALS:   Vitals:   11/07/21 0845  BP: 123/77  Pulse: 78  Resp: 18  SpO2: 98%  Weight: 142 lb (64.4 kg)  Height: 5\' 2"  (1.575 m)    GEN:  The patient appears stated age and is in NAD. HEENT:  Normocephalic, atraumatic.   Neurological examination:  General: NAD, well-groomed, appears stated age. Orientation: The patient is alert. Oriented to person not to place or date.  MMSE today 6/30. Cranial  nerves: There is good facial symmetry.The speech is fluent and clear. No aphasia or dysarthria. Fund of knowledge is reduced. Recent and remote memory are impaired. Attention and concentration are  reduced.  Unable to name objects and repeat phrases.  Hearing is intact to conversational tone.    Sensation: Sensation is intact to light touch throughout Motor: Strength is at least antigravity x4. Tremors: none  DTR's 2/4 in UE/LE     Movement examination: Tone: There is normal tone in the UE/LE Abnormal movements:  no tremor.  No myoclonus.  No asterixis.   Coordination:  There is no decremation with RAM's. Normal finger to nose  Gait and Station: The patient has no difficulty arising out of a deep-seated chair without the use of the hands. The patient's stride length is good.  Gait is cautious and narrow.    Thank you for allowing [place] the opportunity to participate in the care of this nice patient. Please do not hesitate to contact us for any questions or concerns.   Total time spent on today's visit was 50 minutes dedicated to this patient today, preparing to see patient, examining the patient, ordering tests and/or medications and counseling the patient, documenting clinical information in the EHR or other health record, independently interpreting results and communicating results to the patient/family, discussing treatment and goals, answering patient's questions and coordinating care.  Cc:  Milus Height, PA  Marlowe Kays 11/07/2021 9:52 AM

## 2021-12-01 ENCOUNTER — Other Ambulatory Visit: Payer: Self-pay | Admitting: Physician Assistant

## 2021-12-08 DIAGNOSIS — F411 Generalized anxiety disorder: Secondary | ICD-10-CM | POA: Diagnosis not present

## 2021-12-08 DIAGNOSIS — G300 Alzheimer's disease with early onset: Secondary | ICD-10-CM | POA: Diagnosis not present

## 2021-12-08 DIAGNOSIS — F0280 Dementia in other diseases classified elsewhere without behavioral disturbance: Secondary | ICD-10-CM | POA: Diagnosis not present

## 2022-03-19 ENCOUNTER — Other Ambulatory Visit: Payer: Self-pay | Admitting: Physician Assistant

## 2022-04-06 ENCOUNTER — Other Ambulatory Visit: Payer: Self-pay | Admitting: Physician Assistant

## 2022-04-28 DIAGNOSIS — N939 Abnormal uterine and vaginal bleeding, unspecified: Secondary | ICD-10-CM | POA: Diagnosis not present

## 2022-05-10 ENCOUNTER — Encounter: Payer: Self-pay | Admitting: Physician Assistant

## 2022-05-10 ENCOUNTER — Ambulatory Visit: Payer: Federal, State, Local not specified - PPO | Admitting: Physician Assistant

## 2022-05-10 VITALS — HR 76 | Resp 20 | Ht 62.0 in | Wt 140.0 lb

## 2022-05-10 DIAGNOSIS — N939 Abnormal uterine and vaginal bleeding, unspecified: Secondary | ICD-10-CM | POA: Diagnosis not present

## 2022-05-10 DIAGNOSIS — F0280 Dementia in other diseases classified elsewhere without behavioral disturbance: Secondary | ICD-10-CM | POA: Diagnosis not present

## 2022-05-10 DIAGNOSIS — Z3202 Encounter for pregnancy test, result negative: Secondary | ICD-10-CM | POA: Diagnosis not present

## 2022-05-10 DIAGNOSIS — G300 Alzheimer's disease with early onset: Secondary | ICD-10-CM | POA: Diagnosis not present

## 2022-05-10 NOTE — Progress Notes (Signed)
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 RH female with a history of migraines, hyperlipidemia, anxiety and a  diagnosis of Alzheimer's disease per LP. She is on donepezil 10 mg daily. She has been evaluated at [facility] by Dr. Christ Kick, Neurology, concluding that she is not a candidate for Lecanemab given the stage of her disease, Stage FAST IV. He has looked as well for other clinical trials, but given her MMSE at [facility] was <12, she is excluded from participating in those.   Recommendations   Follow up in 6  months. Continue donepezil 10 mg daily. Side effects were discussed  Continue Memantine 10 mg twice daily. Side effects were discussed Continue Lexapro 5 mg daily for anxiety. Side effects discussed  Agree with SW  to discuss planning for the future . Has appointment with [facility] regarding this issue     Subjective:    This patient is accompanied in the office by her husband who supplements the history.  Previous records as well as any outside records available were reviewed prior to todays visit. Patient was last seen on  11/07/21.    Any changes in memory since last visit? STM is about the same, but continues to have difficulty with ADLs, understanding how to use appliances and remote control, and other tools. "Some things are a challenge" . She still remembers the names of people.    repeats oneself?  Occasionally.  Disoriented when walking into a room?  Patient denies  Leaving objects in unusual places?   denies . But "she carries the pocketbook around the house all day"  Wandering behavior?  denies   Any personality changes since last visit?  denies   Any worsening depression?:  denies, but some in anxiety in unknown environment   Hallucinations or paranoia?  denies   Seizures?    denies    Any sleep changes? "Only when the time changed, that was a challenge, she was  sundowning for 2 days".  Denies vivid dreams, REM behavior or sleepwalking   Sleep apnea?   denies   Any hygiene concerns?  She has a "hard time with her periods, as she is perimenopausal and may not remember how to change pads". She has appt with Gyn today to try to stop the periods   Independent of bathing and dressing?  Endorsed  Does the patient needs help with medications? Husband  is in charge   Who is in charge of the finances? Husband  is in charge     Any changes in appetite?  denies     Patient have trouble swallowing?  denies   Does the patient cook? Husband does   Any kitchen accidents such as leaving the stove on? Patient denies   Any headaches?   denies   Chronic back pain  denies   Ambulates with difficulty? denies   Recent falls or head injuries? denies     Unilateral weakness, numbness or tingling? denies   Any tremors?  Very slight on the L hand, 'only when anxious" no other parkinsonian symptoms Any anosmia?  Patient denies   Any incontinence of urine?  denies   Any bowel dysfunction?  denies      Patient lives   Husband  Does the patient drive?No longer drives  Previous note 05/10/2021 Wendy Sparks is a 52 y.o. female  seen today in follow up for memory loss.  This patient is accompanied in the office by her husband who supplements the history.  Previous records as well as any outside records available were reviewed prior to todays visit.  She was last seen on11/29 in follow up. Last MMSE in 11/2020  was 8/30.  MRI brain w and wo contrast 11/2020 revealed parenchymal volume loss greater than expected for age as well as minor chronic microvascular ischemic changes and a prominent pituitary with normal hormonal lab work.  LP on 01/07/2021 was negative for autoimmune, protein, and infectious disease. LP on 04/01/21 showed 14-3-3 gamma CSF positive 6322, T-Tau 285 (H), P-Tau 26.8 (H),  p-Tau/ABeta 42 ration 0.041 (H).  She is currently on donepezil 10 mg daily without side  effects.  Husband reports that her memory has not gotten better, perhaps "a little bit better ". She "is more connected", as well as more active. She is more willing to participate in ADLs, workout routine and eat healthier.   Her temperament is more stable, she is less irritable, but does cry at times when she cannot remember names or words, or dates.  She continues to ask the same questions. She has trouble remembering how to get dressed, or how  to use a remote control,  or to operate the washer and dryer.  She no longer drives.   She continues to do crossword puzzles and word finding as often as she can possibly do.  She sleeps well, denies vivid dreams or sleepwalking.  No hallucinations or paranoia.  No hygiene concerns, she is independent of bathing and dressing.  Her husband is in charge of the medications and finances.  Her appetite is good, denies trouble swallowing, she does not cook.  She ambulates without difficulty, without falls or head injuries.Denies headaches, double vision, dizziness, focal numbness or tingling, unilateral weakness or tremors or anosmia. No history of seizures. Denies urine incontinence, retention, constipation or diarrhea.    Initial visit 12/27/2020 The patient is seen in neurologic consultation at the request of Redmon, Noelle, PA for the evaluation of memory.  The patient is accompanied by husband who supplements the history. She is a 52 year old RH woman who had memory difficulties for the last 3 years. Her husband reported that after having to spend one year in [place] for work, and now working from home, was able to notice significant cognitive decline in the patient, "a big difference in the way she thinks and performs tasks". "Her eyes are glassier than before, I can tell when she has a bad day". Her long term memory is better than the short term-husband says. She has trouble recalling simple words names, recent conversations, and lately, she has forgotten how to set  the dinner table,  and has been finding things in different drawers than the ones they should go. She has also does not remember how to use some home appliances such as the washing machine. She stopped driving because she has gotten lost thee times despite having the GPS, which frustrated her. She no longer cooks on her own, they do it together, because she does not remember common recipes. Her appetite is good and denies trouble swallowing. He denies her leaving the stove on. He husband does the bills and now, he administers her meds. She is independent of dressing and bathing. Ambulates without difficulty without walker or cane. Denies any falls or prior concussions. Denies headaches, double vision, dizziness, focal numbness or tingling, unilateral weakness or tremors. Denies urine incontinence or retention, constipation or  diarrhea. Denies anosmia. Denies history of OSA, ETOH or tobacco. Tobacco. Family History remarkable for mother who had Alzheimer's and passed in her 11s     Labs on 12/09/2020 TSH 1.03 Vitamin D 25 OH 24.3 (low) Vitamin 123456 was 0000000 Folic acid greater than 20 normal Chemistries normal CBC normal   MRI of the brain 12/20/2020 Parenchymal volume loss greater than expected for age. Minor chronic microvascular ischemic changes. Prominent pituitary for which dedicated MRI of the sella is recommended.  PREVIOUS MEDICATIONS:   CURRENT MEDICATIONS:  Outpatient Encounter Medications as of 05/10/2022  Medication Sig   aspirin-acetaminophen-caffeine (EXCEDRIN MIGRAINE) 250-250-65 MG tablet Take by mouth every 6 (six) hours as needed for headache.   atorvastatin (LIPITOR) 10 MG tablet Take 10 mg by mouth daily.   atorvastatin (LIPITOR) 10 MG tablet Take 1 tablet by mouth daily.   cyclobenzaprine (FLEXERIL) 5 MG tablet TAKE 1 TABLET BY MOUTH 2 TIMES A DAY AS NEEDED, LIMIT TO 2 DAYS PER WEEK   donepezil (ARICEPT) 10 MG tablet TAKE HALF TABLET (5 MG) DAILY FOR 2 WEEKS, THEN INCREASE TO THE  FULL TABLET AT 10 MG DAILY   escitalopram (LEXAPRO) 5 MG tablet TAKE 1 TABLET DAILY   memantine (NAMENDA) 10 MG tablet Take 1 tablet twice a day   Multiple Vitamin (MULTI VITAMIN) TABS 1 tablet   No facility-administered encounter medications on file as of 05/10/2022.       05/10/2021    9:00 AM  MMSE - Mini Mental State Exam  Orientation to time 2  Orientation to Place 2  Registration 3  Attention/ Calculation 0  Recall 1  Language- name 2 objects 2  Language- repeat 1  Language- follow 3 step command 3  Language- read & follow direction 1  Write a sentence 0  Copy design 0  Total score 15      12/27/2020    3:00 PM  Montreal Cognitive Assessment   Visuospatial/ Executive (0/5) 0  Naming (0/3) 3  Attention: Read list of digits (0/2) 1  Attention: Read list of letters (0/1) 0  Attention: Serial 7 subtraction starting at 100 (0/3) 0  Language: Repeat phrase (0/2) 0  Language : Fluency (0/1) 0  Abstraction (0/2) 2  Delayed Recall (0/5) 0  Orientation (0/6) 2  Total 8  Adjusted Score (based on education) 8    Objective:     PHYSICAL EXAMINATION:    VITALS:   Vitals:   05/10/22 0905  Pulse: 76  Resp: 20  SpO2: 98%  Weight: 140 lb (63.5 kg)  Height: 5\' 2"  (1.575 m)    GEN:  The patient appears stated age and is in NAD. HEENT:  Normocephalic, atraumatic.   Neurological examination:  General: NAD, well-groomed, appears stated age. Orientation: The patient is alert. Oriented to person, not to place or date Cranial nerves: There is good facial symmetry.The speech is fluent and clear. No aphasia or dysarthria. Fund of knowledge is reduced Recent and remote memory are impaired. Attention and concentration are reduced.  Able to name objects and repeat phrases.  Hearing is intact to conversational tone.    Sensation: Sensation is intact to light touch throughout Motor: Strength is at least antigravity x4. DTR's 2/4 in UE/LE     Movement examination: Tone: There  is normal tone in the UE/LE No cogwheeling  Abnormal movements:  very mild intention tremor.  No myoclonus.  No asterixis.   Coordination:  There is no decremation with RAM's. Normal finger to  nose  Gait and Station: The patient has no difficulty arising out of a deep-seated chair without the use of the hands. The patient's stride length is good.  Gait is cautious and narrow   Thank you for allowing [place] the opportunity to participate in the care of this nice patient. Please do not hesitate to contact us for any questions or concerns.   Total time spent on today's visit was 37 minutes dedicated to this patient today, preparing to see patient, examining the patient, ordering tests and/or medications and counseling the patient, documenting clinical information in the EHR or other health record, independently interpreting results and communicating results to the patient/family, discussing treatment and goals, answering patient's questions and coordinating care.  Cc:  Milus Height, PA  Marlowe Kays 05/10/2022 9:40 AM

## 2022-05-10 NOTE — Patient Instructions (Addendum)
It was a pleasure to see you today at our office.   Recommendations:  Follow up in 6  months Continue donepezil 10 mg daily.  Start  Memantine 10 mg twice a day.    Start Lexapro 5 mg daily for mood  Feel free to visit Facebook page " [employer]" for tips of how to care for people with memory problems.   Whom to call:  Memory  decline, memory medications: Call our office [phone]   For psychiatric meds, mood meds: Please have your primary care physician manage these medications.   Counseling regarding caregiver distress, including caregiver depression, anxiety and issues regarding community resources, adult day care programs, adult living facilities, or memory care questions:   Feel free to contact [place], Social Worker at [phone]   For assessment of decision of mental capacity and competency:  Call Dr. Anthoney Harada, geriatric psychiatrist at [phone]  For guidance in geriatric dementia issues please call [facility] [phone]  For guidance regarding [facility] and if placement were needed at the facility, contact Arnell Asal, Social Worker tel: [phone]  If you have any severe symptoms of a stroke, or other severe issues such as confusion,severe chills or fever, etc call 911 or go to the ER as you may need to be evaluated further     RECOMMENDATIONS FOR ALL PATIENTS WITH MEMORY PROBLEMS: 1. Continue to exercise (Recommend 30 minutes of walking everyday, or 3 hours every week) 2. Increase social interactions - continue going to [place] and enjoy social gatherings with friends and family 3. Eat healthy, avoid fried foods and eat more fruits and vegetables 4. Maintain adequate blood pressure, blood sugar, and blood cholesterol level. Reducing the risk of stroke and cardiovascular disease also helps promoting better memory. 5. Avoid stressful situations. Live a simple life and avoid aggravations. Organize your time and  prepare for the next day in anticipation. 6. Sleep well, avoid any interruptions of sleep and avoid any distractions in the bedroom that may interfere with adequate sleep quality 7. Avoid sugar, avoid sweets as there is a strong link between excessive sugar intake, diabetes, and cognitive impairment We discussed the Mediterranean diet, which has been shown to help patients reduce the risk of progressive memory disorders and reduces cardiovascular risk. This includes eating fish, eat fruits and green leafy vegetables, nuts like almonds and hazelnuts, walnuts, and also use olive oil. Avoid fast foods and fried foods as much as possible. Avoid sweets and sugar as sugar use has been linked to worsening of memory function.  There is always a concern of gradual progression of memory problems. If this is the case, then we may need to adjust level of care according to patient needs. Support, both to the patient and caregiver, should then be put into place.    The Alzheimer's Association is here all day, every day for people facing Alzheimer's disease through our free 24/7 Helpline: [phone]. The Helpline provides reliable information and support to all those who need assistance, such as individuals living with memory loss, Alzheimer's or other dementia, caregivers, health care professionals and the public.  Our highly trained and knowledgeable staff can help you with: Understanding memory loss, dementia and Alzheimer's  Medications and other treatment options  General information about aging and brain health  Skills to provide quality care and to find the best care from professionals  Legal, financial and living-arrangement decisions Our Helpline also features: Confidential care consultation provided by [facility] level clinicians  who can help with decision-making support, crisis assistance and education on issues families face every day  Help in a caller's preferred language using our translation service  that features more than 200 languages and dialects  Referrals to local community programs, services and ongoing support     FALL PRECAUTIONS: Be cautious when walking. Scan the area for obstacles that may increase the risk of trips and falls. When getting up in the mornings, sit up at the edge of the bed for a few minutes before getting out of bed. Consider elevating the bed at the head end to avoid drop of blood pressure when getting up. Walk always in a well-lit room (use night lights in the walls). Avoid area rugs or power cords from appliances in the middle of the walkways. Use a walker or a cane if necessary and consider physical therapy for balance exercise. Get your eyesight checked regularly.  FINANCIAL OVERSIGHT: Supervision, especially oversight when making financial decisions or transactions is also recommended.  HOME SAFETY: Consider the safety of the kitchen when operating appliances like stoves, microwave oven, and blender. Consider having supervision and share cooking responsibilities until no longer able to participate in those. Accidents with firearms and other hazards in the house should be identified and addressed as well.   ABILITY TO BE LEFT ALONE: If patient is unable to contact 911 operator, consider using LifeLine, or when the need is there, arrange for someone to stay with patients. Smoking is a fire hazard, consider supervision or cessation. Risk of wandering should be assessed by caregiver and if detected at any point, supervision and safe proof recommendations should be instituted.  MEDICATION SUPERVISION: Inability to self-administer medication needs to be constantly addressed. Implement a mechanism to ensure safe administration of the medications.   DRIVING: Regarding driving, in patients with progressive memory problems, driving will be impaired. We advise to have someone else do the driving if trouble finding directions or if minor accidents are reported. Independent  driving assessment is available to determine safety of driving.      Mediterranean Diet A Mediterranean diet refers to food and lifestyle choices that are based on the traditions of countries located on the [employer]. This way of eating has been shown to help prevent certain conditions and improve outcomes for people who have chronic diseases, like kidney disease and heart disease. What are tips for following this plan? Lifestyle  Cook and eat meals together with your family, when possible. Drink enough fluid to keep your urine clear or pale yellow. Be physically active every day. This includes: Aerobic exercise like running or swimming. Leisure activities like gardening, walking, or housework. Get 7-8 hours of sleep each night. If recommended by your health care provider, drink red wine in moderation. This means 1 glass a day for nonpregnant women and 2 glasses a day for men. A glass of wine equals 5 oz (150 mL). Reading food labels  Check the serving size of packaged foods. For foods such as rice and pasta, the serving size refers to the amount of cooked product, not dry. Check the total fat in packaged foods. Avoid foods that have saturated fat or trans fats. Check the ingredients list for added sugars, such as corn syrup. Shopping  At the grocery store, buy most of your food from the areas near the walls of the store. This includes: Fresh fruits and vegetables (produce). Grains, beans, nuts, and seeds. Some of these may be available in unpackaged forms or large amounts (  in bulk). Fresh seafood. Poultry and eggs. Low-fat dairy products. Buy whole ingredients instead of prepackaged foods. Buy fresh fruits and vegetables in-season from local farmers markets. Buy frozen fruits and vegetables in resealable bags. If you do not have access to quality fresh seafood, buy precooked frozen shrimp or canned fish, such as tuna, salmon, or sardines. Buy small amounts of raw or cooked  vegetables, salads, or olives from the deli or salad bar at your store. Stock your pantry so you always have certain foods on hand, such as olive oil, canned tuna, canned tomatoes, rice, pasta, and beans. Cooking  Cook foods with extra-virgin olive oil instead of using butter or other vegetable oils. Have meat as a side dish, and have vegetables or grains as your main dish. This means having meat in small portions or adding small amounts of meat to foods like pasta or stew. Use beans or vegetables instead of meat in common dishes like chili or lasagna. Experiment with different cooking methods. Try roasting or broiling vegetables instead of steaming or sauteing them. Add frozen vegetables to soups, stews, pasta, or rice. Add nuts or seeds for added healthy fat at each meal. You can add these to yogurt, salads, or vegetable dishes. Marinate fish or vegetables using olive oil, lemon juice, garlic, and fresh herbs. Meal planning  Plan to eat 1 vegetarian meal one day each week. Try to work up to 2 vegetarian meals, if possible. Eat seafood 2 or more times a week. Have healthy snacks readily available, such as: Vegetable sticks with hummus. Greek yogurt. Fruit and nut trail mix. Eat balanced meals throughout the week. This includes: Fruit: 2-3 servings a day Vegetables: 4-5 servings a day Low-fat dairy: 2 servings a day Fish, poultry, or lean meat: 1 serving a day Beans and legumes: 2 or more servings a week Nuts and seeds: 1-2 servings a day Whole grains: 6-8 servings a day Extra-virgin olive oil: 3-4 servings a day Limit red meat and sweets to only a few servings a month What are my food choices? Mediterranean diet Recommended Grains: Whole-grain pasta. Brown rice. Bulgar wheat. Polenta. Couscous. Whole-wheat bread. Orpah Cobb. Vegetables: Artichokes. Beets. Broccoli. Cabbage. Carrots. Eggplant. Green beans. Chard. Kale. Spinach. Onions. Leeks. Peas. Squash. Tomatoes. Peppers.  Radishes. Fruits: Apples. Apricots. Avocado. Berries. Bananas. Cherries. Dates. Figs. Grapes. Lemons. Melon. Oranges. Peaches. Plums. Pomegranate. Meats and other protein foods: Beans. Almonds. Sunflower seeds. Pine nuts. Peanuts. Cod. Salmon. Scallops. Shrimp. Tuna. Tilapia. Clams. Oysters. Eggs. Dairy: Low-fat milk. Cheese. Greek yogurt. Beverages: Water. Red wine. Herbal tea. Fats and oils: Extra virgin olive oil. Avocado oil. Grape seed oil. Sweets and desserts: [place] yogurt with honey. Baked apples. Poached pears. Trail mix. Seasoning and other foods: Basil. Cilantro. Coriander. Cumin. Mint. Parsley. Sage. Rosemary. Tarragon. Garlic. Oregano. Thyme. Pepper. Balsalmic vinegar. Tahini. Hummus. Tomato sauce. Olives. Mushrooms. Limit these Grains: Prepackaged pasta or rice dishes. Prepackaged cereal with added sugar. Vegetables: Deep fried potatoes (french fries). Fruits: Fruit canned in syrup. Meats and other protein foods: Beef. Pork. Lamb. Poultry with skin. Hot dogs. Tomasa Blase. Dairy: Ice cream. Sour cream. Whole milk. Beverages: Juice. Sugar-sweetened soft drinks. Beer. Liquor and spirits. Fats and oils: Butter. Canola oil. Vegetable oil. Beef fat (tallow). Lard. Sweets and desserts: Cookies. Cakes. Pies. Candy. Seasoning and other foods: Mayonnaise. Premade sauces and marinades. The items listed may not be a complete list. Talk with your dietitian about what dietary choices are right for you. Summary The Mediterranean diet includes both food and lifestyle  choices. Eat a variety of fresh fruits and vegetables, beans, nuts, seeds, and whole grains. Limit the amount of red meat and sweets that you eat. Talk with your health care provider about whether it is safe for you to drink red wine in moderation. This means 1 glass a day for nonpregnant women and 2 glasses a day for men. A glass of wine equals 5 oz (150 mL). This information is not intended to replace advice given to you by your health  care provider. Make sure you discuss any questions you have with your health care provider. Document Released: 12/09/2015 Document Revised: 01/11/2016 Document Reviewed: 12/09/2015 Elsevier Interactive Patient Education  2017 [employer].

## 2022-05-24 DIAGNOSIS — N926 Irregular menstruation, unspecified: Secondary | ICD-10-CM | POA: Diagnosis not present

## 2022-06-15 DIAGNOSIS — E7800 Pure hypercholesterolemia, unspecified: Secondary | ICD-10-CM | POA: Diagnosis not present

## 2022-06-15 DIAGNOSIS — Z0000 Encounter for general adult medical examination without abnormal findings: Secondary | ICD-10-CM | POA: Diagnosis not present

## 2022-06-15 DIAGNOSIS — Z23 Encounter for immunization: Secondary | ICD-10-CM | POA: Diagnosis not present

## 2022-07-01 ENCOUNTER — Other Ambulatory Visit: Payer: Self-pay | Admitting: Physician Assistant

## 2022-08-11 ENCOUNTER — Other Ambulatory Visit: Payer: Self-pay | Admitting: Physician Assistant

## 2022-11-08 ENCOUNTER — Encounter: Payer: Self-pay | Admitting: Physician Assistant

## 2022-11-08 ENCOUNTER — Telehealth: Payer: Self-pay

## 2022-11-08 ENCOUNTER — Ambulatory Visit: Payer: Federal, State, Local not specified - PPO | Admitting: Physician Assistant

## 2022-11-08 DIAGNOSIS — Z029 Encounter for administrative examinations, unspecified: Secondary | ICD-10-CM

## 2022-11-08 NOTE — Telephone Encounter (Signed)
I left message to contact us to check on her. To call us back.

## 2022-12-12 ENCOUNTER — Other Ambulatory Visit: Payer: Self-pay | Admitting: Physician Assistant

## 2022-12-13 ENCOUNTER — Other Ambulatory Visit: Payer: Self-pay | Admitting: Neurology

## 2022-12-28 ENCOUNTER — Ambulatory Visit: Payer: Federal, State, Local not specified - PPO | Admitting: Physician Assistant

## 2023-01-03 ENCOUNTER — Other Ambulatory Visit: Payer: Self-pay | Admitting: Physician Assistant

## 2023-01-03 ENCOUNTER — Telehealth: Payer: Self-pay | Admitting: Physician Assistant

## 2023-01-03 MED ORDER — DIVALPROEX SODIUM 125 MG PO DR TAB: 125.0000 mg | DELAYED_RELEASE_TABLET | Freq: Two times a day (BID) | ORAL | 3 refills | Status: DC

## 2023-01-03 NOTE — Telephone Encounter (Signed)
Getting agigated,starting to cuss.walking around the house a lot.

## 2023-01-03 NOTE — Telephone Encounter (Signed)
Patient's POA notified of medication sent in, has follow up next week to discuss.

## 2023-01-03 NOTE — Telephone Encounter (Signed)
Take depakote at night for 3-4 days and then can increase to twice a day . Hope it helps

## 2023-01-03 NOTE — Telephone Encounter (Signed)
Caller stated he would like to speak with nurse about miss Jantz's condition. Stated she getting mean and lashing out

## 2023-01-05 ENCOUNTER — Other Ambulatory Visit: Payer: Self-pay | Admitting: Physician Assistant

## 2023-01-08 ENCOUNTER — Ambulatory Visit: Payer: Federal, State, Local not specified - PPO | Admitting: Physician Assistant

## 2023-01-08 ENCOUNTER — Encounter: Payer: Self-pay | Admitting: Physician Assistant

## 2023-01-08 VITALS — BP 116/65 | HR 89 | Ht 63.0 in | Wt 132.0 lb

## 2023-01-08 DIAGNOSIS — G300 Alzheimer's disease with early onset: Secondary | ICD-10-CM

## 2023-01-08 DIAGNOSIS — F0280 Dementia in other diseases classified elsewhere without behavioral disturbance: Secondary | ICD-10-CM | POA: Diagnosis not present

## 2023-01-08 MED ORDER — ESCITALOPRAM OXALATE 10 MG PO TABS: 10.0000 mg | ORAL_TABLET | Freq: Every day | ORAL | 3 refills | Status: AC

## 2023-01-08 MED ORDER — MEMANTINE HCL 10 MG PO TABS: ORAL_TABLET | ORAL | 3 refills | Status: AC

## 2023-01-08 NOTE — Patient Instructions (Addendum)
It was a pleasure to see you today at our office.   Recommendations:  Follow up in 6  months Ok with discontinuing donepezil 10 mg daily.  Continue  Memantine 10 mg twice a day.    Continue Lexapro  10 mg daily for mood Continue Depakote 125 mg  three times  a day for sundowning, hallucinations side effects discussed.  Visit [facility] to help you on "how to deal with situations"  Whom to call:  Memory  decline, memory medications: Call our office [phone]   PLease feel free to call Sidney Ace, Social Worker at [facility] at [phone] for guidance if placement were needed at the facility.    For psychiatric meds, mood meds: Please have your primary care physician manage these medications.   Counseling regarding caregiver distress, including caregiver depression, anxiety and issues regarding community resources, adult day care programs, adult living facilities, or memory care questions:   Feel free to contact Misty Lisabeth Register, Social Worker at [phone]   For assessment of decision of mental capacity and competency:  Call Dr. Erick Blinks, geriatric psychiatrist at [phone]  For guidance in geriatric dementia issues please call [facility] [phone]  For guidance regarding [facility] and if placement were needed at the facility, contact Sidney Ace, Social Worker tel: [phone]  If you have any severe symptoms of a stroke, or other severe issues such as confusion,severe chills or fever, etc call 911 or go to the ER as you may need to be evaluated further     RECOMMENDATIONS FOR ALL PATIENTS WITH MEMORY PROBLEMS: 1. Continue to exercise (Recommend 30 minutes of walking everyday, or 3 hours every week) 2. Increase social interactions - continue going to [place] and enjoy social gatherings with friends and family 3. Eat healthy, avoid fried foods and eat more fruits and vegetables 4. Maintain  adequate blood pressure, blood sugar, and blood cholesterol level. Reducing the risk of stroke and cardiovascular disease also helps promoting better memory. 5. Avoid stressful situations. Live a simple life and avoid aggravations. Organize your time and prepare for the next day in anticipation. 6. Sleep well, avoid any interruptions of sleep and avoid any distractions in the bedroom that may interfere with adequate sleep quality 7. Avoid sugar, avoid sweets as there is a strong link between excessive sugar intake, diabetes, and cognitive impairment We discussed the Mediterranean diet, which has been shown to help patients reduce the risk of progressive memory disorders and reduces cardiovascular risk. This includes eating fish, eat fruits and green leafy vegetables, nuts like almonds and hazelnuts, walnuts, and also use olive oil. Avoid fast foods and fried foods as much as possible. Avoid sweets and sugar as sugar use has been linked to worsening of memory function.  There is always a concern of gradual progression of memory problems. If this is the case, then we may need to adjust level of care according to patient needs. Support, both to the patient and caregiver, should then be put into place.    The Alzheimer[employer] Association is here all day, every day for people facing Alzheimer's disease through our free 24/7 Helpline: 7657127090. The Helpline provides reliable information and support to all those who need assistance, such as individuals living with memory loss, Alzheimer's or other dementia, caregivers, health care professionals and the public.  Our highly trained and knowledgeable staff can help you with: Understanding memory loss, dementia and Alzheimer's  Medications and other treatment options  General  information about aging and brain health  Skills to provide quality care and to find the best care from professionals  Legal, financial and living-arrangement decisions Our Helpline also  features: Confidential care consultation provided by master's level clinicians who can help with decision-making support, crisis assistance and education on issues families face every day  Help in a caller's preferred language using our translation service that features more than 200 languages and dialects  Referrals to local community programs, services and ongoing support     FALL PRECAUTIONS: Be cautious when walking. Scan the area for obstacles that may increase the risk of trips and falls. When getting up in the mornings, sit up at the edge of the bed for a few minutes before getting out of bed. Consider elevating the bed at the head end to avoid drop of blood pressure when getting up. Walk always in a well-lit room (use night lights in the walls). Avoid area rugs or power cords from appliances in the middle of the walkways. Use a walker or a cane if necessary and consider physical therapy for balance exercise. Get your eyesight checked regularly.  FINANCIAL OVERSIGHT: Supervision, especially oversight when making financial decisions or transactions is also recommended.  HOME SAFETY: Consider the safety of the kitchen when operating appliances like stoves, microwave oven, and blender. Consider having supervision and share cooking responsibilities until no longer able to participate in those. Accidents with firearms and other hazards in the house should be identified and addressed as well.   ABILITY TO BE LEFT ALONE: If patient is unable to contact 911 operator, consider using LifeLine, or when the need is there, arrange for someone to stay with patients. Smoking is a fire hazard, consider supervision or cessation. Risk of wandering should be assessed by caregiver and if detected at any point, supervision and safe proof recommendations should be instituted.  MEDICATION SUPERVISION: Inability to self-administer medication needs to be constantly addressed. Implement a mechanism to ensure safe  administration of the medications.   DRIVING: Regarding driving, in patients with progressive memory problems, driving will be impaired. We advise to have someone else do the driving if trouble finding directions or if minor accidents are reported. Independent driving assessment is available to determine safety of driving.      Mediterranean Diet A Mediterranean diet refers to food and lifestyle choices that are based on the traditions of countries located on the [place]. This way of eating has been shown to help prevent certain conditions and improve outcomes for people who have chronic diseases, like kidney disease and heart disease. What are tips for following this plan? Lifestyle  Cook and eat meals together with your family, when possible. Drink enough fluid to keep your urine clear or pale yellow. Be physically active every day. This includes: Aerobic exercise like running or swimming. Leisure activities like gardening, walking, or housework. Get 7-8 hours of sleep each night. If recommended by your health care provider, drink red wine in moderation. This means 1 glass a day for nonpregnant women and 2 glasses a day for men. A glass of wine equals 5 oz (150 mL). Reading food labels  Check the serving size of packaged foods. For foods such as rice and pasta, the serving size refers to the amount of cooked product, not dry. Check the total fat in packaged foods. Avoid foods that have saturated fat or trans fats. Check the ingredients list for added sugars, such as corn syrup. Shopping  At [employer],  buy most of your food from the areas near the walls of the store. This includes: Fresh fruits and vegetables (produce). Grains, beans, nuts, and seeds. Some of these may be available in unpackaged forms or large amounts (in bulk). Fresh seafood. Poultry and eggs. Low-fat dairy products. Buy whole ingredients instead of prepackaged foods. Buy fresh fruits and vegetables  in-season from local farmers markets. Buy frozen fruits and vegetables in resealable bags. If you do not have access to quality fresh seafood, buy precooked frozen shrimp or canned fish, such as tuna, salmon, or sardines. Buy small amounts of raw or cooked vegetables, salads, or olives from the deli or salad bar at your store. Stock your pantry so you always have certain foods on hand, such as olive oil, canned tuna, canned tomatoes, rice, pasta, and beans. Cooking  Cook foods with extra-virgin olive oil instead of using butter or other vegetable oils. Have meat as a side dish, and have vegetables or grains as your main dish. This means having meat in small portions or adding small amounts of meat to foods like pasta or stew. Use beans or vegetables instead of meat in common dishes like chili or lasagna. Experiment with different cooking methods. Try roasting or broiling vegetables instead of steaming or sauteing them. Add frozen vegetables to soups, stews, pasta, or rice. Add nuts or seeds for added healthy fat at each meal. You can add these to yogurt, salads, or vegetable dishes. Marinate fish or vegetables using olive oil, lemon juice, garlic, and fresh herbs. Meal planning  Plan to eat 1 vegetarian meal one day each week. Try to work up to 2 vegetarian meals, if possible. Eat seafood 2 or more times a week. Have healthy snacks readily available, such as: Vegetable sticks with hummus. Greek yogurt. Fruit and nut trail mix. Eat balanced meals throughout the week. This includes: Fruit: 2-3 servings a day Vegetables: 4-5 servings a day Low-fat dairy: 2 servings a day Fish, poultry, or lean meat: 1 serving a day Beans and legumes: 2 or more servings a week Nuts and seeds: 1-2 servings a day Whole grains: 6-8 servings a day Extra-virgin olive oil: 3-4 servings a day Limit red meat and sweets to only a few servings a month What are my food choices? Mediterranean  diet Recommended Grains: Whole-grain pasta. Brown rice. Bulgar wheat. Polenta. Couscous. Whole-wheat bread. Orpah Cobb. Vegetables: Artichokes. Beets. Broccoli. Cabbage. Carrots. Eggplant. Green beans. Chard. Kale. Spinach. Onions. Leeks. Peas. Squash. Tomatoes. Peppers. Radishes. Fruits: Apples. Apricots. Avocado. Berries. Bananas. Cherries. Dates. Figs. Grapes. Lemons. Melon. Oranges. Peaches. Plums. Pomegranate. Meats and other protein foods: Beans. Almonds. Sunflower seeds. Pine nuts. Peanuts. Cod. Salmon. Scallops. Shrimp. Tuna. Tilapia. Clams. Oysters. Eggs. Dairy: Low-fat milk. Cheese. Greek yogurt. Beverages: Water. Red wine. Herbal tea. Fats and oils: Extra virgin olive oil. Avocado oil. Grape seed oil. Sweets and desserts: [place] yogurt with honey. Baked apples. Poached pears. Trail mix. Seasoning and other foods: Basil. Cilantro. Coriander. Cumin. Mint. Parsley. Sage. Rosemary. Tarragon. Garlic. Oregano. Thyme. Pepper. Balsalmic vinegar. Tahini. Hummus. Tomato sauce. Olives. Mushrooms. Limit these Grains: Prepackaged pasta or rice dishes. Prepackaged cereal with added sugar. Vegetables: Deep fried potatoes (french fries). Fruits: Fruit canned in syrup. Meats and other protein foods: Beef. Pork. Lamb. Poultry with skin. Hot dogs. Tomasa Blase. Dairy: Ice cream. Sour cream. Whole milk. Beverages: Juice. Sugar-sweetened soft drinks. Beer. Liquor and spirits. Fats and oils: Butter. Canola oil. Vegetable oil. Beef fat (tallow). Lard. Sweets and desserts: Cookies. Cakes. Pies. Candy. Seasoning  and other foods: Mayonnaise. Premade sauces and marinades. The items listed may not be a complete list. Talk with your dietitian about what dietary choices are right for you. Summary The Mediterranean diet includes both food and lifestyle choices. Eat a variety of fresh fruits and vegetables, beans, nuts, seeds, and whole grains. Limit the amount of red meat and sweets that you eat. Talk with your  health care provider about whether it is safe for you to drink red wine in moderation. This means 1 glass a day for nonpregnant women and 2 glasses a day for men. A glass of wine equals 5 oz (150 mL). This information is not intended to replace advice given to you by your health care provider. Make sure you discuss any questions you have with your health care provider. Document Released: 12/09/2015 Document Revised: 01/11/2016 Document Reviewed: 12/09/2015 Elsevier Interactive Patient Education  2017 [employer].

## 2023-01-08 NOTE — Progress Notes (Signed)
Assessment/Plan:   Dementia due to Alzheimer's disease, early onset, with behavioral disturbance.   Wendy Sparks is a very pleasant 52 y.o. RH female with a history of migraines, hyperlipidemia, anxiety, and a diagnosis of Alzheimer's disease per  LP.  She has been evaluated at [facility] by Dr. Isla Pence, Neurology, concluding that she is not a candidate for Lecanemab given the stage of her disease, Stage FAST IV.  She has looked as well for other clinical trials, but given her MMSE at [facility] at the time was <12, she was excluded from participating in those.  Seen today in follow up for memory loss. There is significant cognitive and behavioral decline, needing more assistance with her ADLs. She also has moments of agitation and sundowning for memory, the patient was on donepezil 10mg  daily and memantine 10 mg twice daily.  Her husband discontinued her donepezil, as it was no longer therapeutic.  More decline occurs, at which time he will discontinue.  He prefers to concentrate on better control of her mood issues.  Ideally, we would prefer to start Rexulti on her, but the cost is prohibitive.  Will try to look for resources to help this nice patient achieve better control of agitation.  For now, she will continue Depakote which seems to be tolerated.   Follow up in 6 months. Continue memantine 10 mg twice daily, side effects discussed Continue Depakote, increase it 125 mg 3 times a day, side effects discussed Continue Lexapro, may increase to 10 mg for anxiety, side effects discussed test Recommend good control of her cardiovascular risk factors Continue to control mood as per PCP Agree with adult day program for social stimulation and safety as she needs 24/7 monitoring    Subjective:    This patient is accompanied in the office by her husband who supplements the history.  Previous records as well as any outside records available were reviewed prior to todays visit. Patient was last seen  on 05/10/2022.   Any changes in memory since last visit? "  Memory is worse ".  She is not conversant.  She has trouble recalling conversations although she still be able to recognize the people.  Decreased comprehension is noted.  She also has more difficulty with ADLs, needs more assistance than prior.    repeats oneself?  Endorsed Disoriented when walking into a room?  Patient denies Leaving objects in unusual places? "Things go missing". Collects napkins and toilet paper, sometimes she places them in the pocketbook. She also loses the remote control or the phone .    Wandering behavior?  denies   Any personality changes since last visit?  There were moments of high anxiety and agitation and sundowning.  At times, she curses, which is "unlike her ".  Recently she was started on Depakote, which seems to help Any worsening depression?:  Denies.   Hallucinations or paranoia?  Denies.  However, "she may talk about people that he does not know " Seizures? denies    Any sleep changes?  Gets up at night . Likes to walk around, she has inability to relax. She was scratching her forehead for the last 6 months, there are moments in which she wears mittens.  No apparent vivid dreams or REM behavior or sleepwalking  Sleep apnea?   Denies.   Any hygiene concerns?  She does not know how to use the water so she leaves it on.  She has lost the ability to remember how to use the  shower. Independent of bathing and dressing?  She does not remember how to choose clothing or how to change.  Her husband has to do it for her. Does the patient needs help with medications?  Husband is in charge Who is in charge of the finances?  Husband   is in charge Any changes in appetite?  Denies, her husband is in charge of the meals.  Forgets how to use a utensils .      Patient have trouble swallowing? Denies.   Does the patient cook? No Any headaches?   denies   Chronic back pain  denies   Ambulates with difficulty?  "Trending down", needs more time to get there, recently she got a handicapped ticket, because she walks slowly.  Recent falls or head injuries? denies     Unilateral weakness, numbness or tingling? denies   Any tremors?  Very mild tremors when she is anxious the left hand, as before.  She may have some fasciculations as well. Any anosmia?  Denies   Any incontinence of urine?  Endorsed , he has to help her wipe, she wears diapers Any bowel dysfunction? Endorsed, "she has trouble holding it "she has to wipe her, and she is now wearing diapers. Patient lives with her husband and she has a HHN that comes to help. Does the patient drive? No longer drives     Previous note 05/10/2021 Wendy Sparks is a 52 y.o. female  seen today in follow up for memory loss. This patient is accompanied in the office by her husband who supplements the history.  Previous records as well as any outside records available were reviewed prior to todays visit.  She was last seen on11/29 in follow up. Last MMSE in 11/2020  was 8/30.  MRI brain w and wo contrast 11/2020 revealed parenchymal volume loss greater than expected for age as well as minor chronic microvascular ischemic changes and a prominent pituitary with normal hormonal lab work.  LP on 01/07/2021 was negative for autoimmune, protein, and infectious disease. LP on 04/01/21 showed 14-3-3 gamma CSF positive 6322, T-Tau 285 (H), P-Tau 26.8 (H),  p-Tau/ABeta 42 ration 0.041 (H).  She is currently on donepezil 10 mg daily without side effects.  Husband reports that her memory has not gotten better, perhaps "a little bit better ". She "is more connected", as well as more active. She is more willing to participate in ADLs, workout routine and eat healthier.   Her temperament is more stable, she is less irritable, but does cry at times when she cannot remember names or words, or dates.  She continues to ask the same questions. She has trouble remembering how to get dressed, or how   to use a remote control,  or to operate the washer and dryer.  She no longer drives.   She continues to do crossword puzzles and word finding as often as she can possibly do.  She sleeps well, denies vivid dreams or sleepwalking.  No hallucinations or paranoia.  No hygiene concerns, she is independent of bathing and dressing.  Her husband is in charge of the medications and finances.  Her appetite is good, denies trouble swallowing, she does not cook.  She ambulates without difficulty, without falls or head injuries.Denies headaches, double vision, dizziness, focal numbness or tingling, unilateral weakness or tremors or anosmia. No history of seizures. Denies urine incontinence, retention, constipation or diarrhea.    Initial visit 12/27/2020 The patient is seen in neurologic consultation at the  request of [place], [place], [place] for the evaluation of memory.  The patient is accompanied by husband who supplements the history. She is a 52 year old RH woman who had memory difficulties for the last 3 years. Her husband reported that after having to spend one year in [place] for work, and now working from home, was able to notice significant cognitive decline in the patient, "a big difference in the way she thinks and performs tasks". "Her eyes are glassier than before, I can tell when she has a bad day". Her long term memory is better than the short term-husband says. She has trouble recalling simple words names, recent conversations, and lately, she has forgotten how to set the dinner table,  and has been finding things in different drawers than the ones they should go. She has also does not remember how to use some home appliances such as the washing machine. She stopped driving because she has gotten lost thee times despite having the GPS, which frustrated her. She no longer cooks on her own, they do it together, because she does not remember common recipes. Her appetite is good and denies trouble swallowing. He  denies her leaving the stove on. He husband does the bills and now, he administers her meds. She is independent of dressing and bathing. Ambulates without difficulty without walker or cane. Denies any falls or prior concussions. Denies headaches, double vision, dizziness, focal numbness or tingling, unilateral weakness or tremors. Denies urine incontinence or retention, constipation or diarrhea. Denies anosmia. Denies history of OSA, ETOH or tobacco. Tobacco. Family History remarkable for mother who had Alzheimer's and passed in her 38s     Labs on 12/09/2020 TSH 1.03 Vitamin D 25 OH 24.3 (low) Vitamin B12 was 764 Folic acid greater than 20 normal Chemistries normal CBC normal   MRI of the brain 12/20/2020 Parenchymal volume loss greater than expected for age. Minor chronic microvascular ischemic changes. Prominent pituitary for which dedicated MRI of the sella is recommended.   PREVIOUS MEDICATIONS:   CURRENT MEDICATIONS:  Outpatient Encounter Medications as of 01/08/2023  Medication Sig   aspirin-acetaminophen-caffeine (EXCEDRIN MIGRAINE) 250-250-65 MG tablet Take by mouth every 6 (six) hours as needed for headache.   atorvastatin (LIPITOR) 10 MG tablet Take 1 tablet by mouth daily.   Multiple Vitamin (MULTI VITAMIN) TABS 1 tablet   [DISCONTINUED] divalproex (DEPAKOTE) 125 MG DR tablet Take 1 tablet (125 mg total) by mouth 2 (two) times daily.   [DISCONTINUED] escitalopram (LEXAPRO) 5 MG tablet TAKE 1 TABLET DAILY   [DISCONTINUED] memantine (NAMENDA) 10 MG tablet Take 1 tablet twice a day   atorvastatin (LIPITOR) 10 MG tablet Take 10 mg by mouth daily. (Patient not taking: Reported on 01/08/2023)   cyclobenzaprine (FLEXERIL) 5 MG tablet TAKE 1 TABLET BY MOUTH 2 TIMES A DAY AS NEEDED, LIMIT TO 2 DAYS PER WEEK (Patient not taking: Reported on 01/08/2023)   escitalopram (LEXAPRO) 10 MG tablet Take 1 tablet (10 mg total) by mouth daily. TAKE 1 TABLET DAILY   memantine (NAMENDA) 10 MG tablet Take 1  tablet twice a day   [DISCONTINUED] donepezil (ARICEPT) 10 MG tablet TAKE HALF TABLET (5 MG) DAILY FOR 2 WEEKS, THEN INCREASE TO THE FULL TABLET AT 10 MG DAILY (Patient not taking: Reported on 01/08/2023)   No facility-administered encounter medications on file as of 01/08/2023.       05/10/2021    9:00 AM  MMSE - Mini Mental State Exam  Orientation to time 2  Orientation to Place 2  Registration 3  Attention/ Calculation 0  Recall 1  Language- name 2 objects 2  Language- repeat 1  Language- follow 3 step command 3  Language- read & follow direction 1  Write a sentence 0  Copy design 0  Total score 15      12/27/2020    3:00 PM  Montreal Cognitive Assessment   Visuospatial/ Executive (0/5) 0  Naming (0/3) 3  Attention: Read list of digits (0/2) 1  Attention: Read list of letters (0/1) 0  Attention: Serial 7 subtraction starting at 100 (0/3) 0  Language: Repeat phrase (0/2) 0  Language : Fluency (0/1) 0  Abstraction (0/2) 2  Delayed Recall (0/5) 0  Orientation (0/6) 2  Total 8  Adjusted Score (based on education) 8    Objective:     PHYSICAL EXAMINATION:    VITALS:   Vitals:   01/08/23 1507  BP: 116/65  Pulse: 89  SpO2: 98%  Weight: 132 lb (59.9 kg)  Height: 5\' 3"  (1.6 m)    GEN:  The patient appears stated age and is in NAD. HEENT:  Normocephalic, atraumatic.   Neurological examination:  General: NAD, well-groomed, appears stated age. Orientation: The patient is alert.  Not oriented to person, place or date. Cranial nerves: There is good facial symmetry.The speech is known fluent, she is less verbal than before, but clear.  No aphasia or dysarthria. Fund of knowledge is reduced.  Recent and remote memory are impaired. Attention and concentration are reduced.  Unable to name objects and repeat phrases.  Hearing is intact to conversational tone.  Sensation: Sensation is intact to light touch throughout Motor: Strength is at least antigravity x4. DTR's 2/4 in  UE/LE     Movement examination: Tone: There is normal tone in the UE/LE Abnormal movements:  no tremor.  No myoclonus noted during the exam.  No asterixis.   Coordination:  There is  decremation with RAM's.  Abnormal finger to nose, left worse than right and following commands and understanding instructions. Gait and Station: The patient has no difficulty arising out of a deep-seated chair without the use of the hands. The patient's stride length is shorter gait is cautious and wider than prior.   Thank you for allowing [place] the opportunity to participate in the care of this nice patient. Please do not hesitate to contact us for any questions or concerns.   Total time spent on today's visit was 35 minutes dedicated to this patient today, preparing to see patient, examining the patient, ordering tests and/or medications and counseling the patient, documenting clinical information in the EHR or other health record, independently interpreting results and communicating results to the patient/family, discussing treatment and goals, answering patient's questions and coordinating care.  Cc:  Milus Height, PA  Marlowe Kays 01/08/2023 5:24 PM

## 2023-03-08 DIAGNOSIS — Z01419 Encounter for gynecological examination (general) (routine) without abnormal findings: Secondary | ICD-10-CM | POA: Diagnosis not present

## 2023-04-30 ENCOUNTER — Encounter: Payer: Self-pay | Admitting: Physician Assistant

## 2023-06-05 ENCOUNTER — Other Ambulatory Visit: Payer: Self-pay | Admitting: Physician Assistant

## 2023-06-22 DIAGNOSIS — G300 Alzheimer's disease with early onset: Secondary | ICD-10-CM | POA: Diagnosis not present

## 2023-06-22 DIAGNOSIS — Z0000 Encounter for general adult medical examination without abnormal findings: Secondary | ICD-10-CM | POA: Diagnosis not present

## 2023-06-22 DIAGNOSIS — N1831 Chronic kidney disease, stage 3a: Secondary | ICD-10-CM | POA: Diagnosis not present

## 2023-06-22 DIAGNOSIS — Z23 Encounter for immunization: Secondary | ICD-10-CM | POA: Diagnosis not present

## 2023-06-22 DIAGNOSIS — E7800 Pure hypercholesterolemia, unspecified: Secondary | ICD-10-CM | POA: Diagnosis not present

## 2023-07-05 ENCOUNTER — Ambulatory Visit: Payer: Federal, State, Local not specified - PPO | Admitting: Physician Assistant

## 2023-07-05 ENCOUNTER — Encounter: Payer: Self-pay | Admitting: Physician Assistant

## 2023-07-05 DIAGNOSIS — Z029 Encounter for administrative examinations, unspecified: Secondary | ICD-10-CM

## 2023-07-09 ENCOUNTER — Ambulatory Visit: Payer: Self-pay | Admitting: Physician Assistant

## 2023-09-28 DIAGNOSIS — E7800 Pure hypercholesterolemia, unspecified: Secondary | ICD-10-CM | POA: Diagnosis not present

## 2023-10-23 ENCOUNTER — Other Ambulatory Visit: Payer: Self-pay | Admitting: Physician Assistant

## 2023-10-23 DIAGNOSIS — Z1231 Encounter for screening mammogram for malignant neoplasm of breast: Secondary | ICD-10-CM

## 2023-10-26 ENCOUNTER — Ambulatory Visit
Admission: RE | Admit: 2023-10-26 | Discharge: 2023-10-26 | Disposition: A | Discharge status: Home / Self Care | Source: Ambulatory Visit | Attending: Physician Assistant | Admitting: Physician Assistant

## 2023-10-26 DIAGNOSIS — Z1231 Encounter for screening mammogram for malignant neoplasm of breast: Secondary | ICD-10-CM

## 2024-06-19 ENCOUNTER — Ambulatory Visit: Admitting: Physician Assistant
